# Patient Record
Sex: Female | Born: 1980 | Race: Black or African American | Hispanic: No | Marital: Single | Smoking: Former smoker
Health system: Southern US, Community
[De-identification: ages and names within clinical notes are randomized; demographics above are authoritative.]

## PROBLEM LIST (undated history)

## (undated) DIAGNOSIS — R0600 Dyspnea, unspecified: Secondary | ICD-10-CM

## (undated) DIAGNOSIS — E785 Hyperlipidemia, unspecified: Secondary | ICD-10-CM

## (undated) DIAGNOSIS — G43909 Migraine, unspecified, not intractable, without status migrainosus: Secondary | ICD-10-CM

## (undated) DIAGNOSIS — D259 Leiomyoma of uterus, unspecified: Secondary | ICD-10-CM

## (undated) DIAGNOSIS — I10 Essential (primary) hypertension: Secondary | ICD-10-CM

## (undated) DIAGNOSIS — R51 Headache: Secondary | ICD-10-CM

## (undated) DIAGNOSIS — D649 Anemia, unspecified: Secondary | ICD-10-CM

## (undated) HISTORY — PX: UTERINE FIBROID SURGERY: SHX826

## (undated) HISTORY — PX: TONSILLECTOMY: SUR1361

---

## 2001-03-23 ENCOUNTER — Emergency Department (HOSPITAL_COMMUNITY): Admission: EM | Admit: 2001-03-23 | Discharge: 2001-03-23 | Payer: Self-pay | Admitting: *Deleted

## 2001-03-31 ENCOUNTER — Emergency Department (HOSPITAL_COMMUNITY): Admission: EM | Admit: 2001-03-31 | Discharge: 2001-03-31 | Payer: Self-pay | Admitting: Emergency Medicine

## 2002-10-05 ENCOUNTER — Encounter: Payer: Self-pay | Admitting: Emergency Medicine

## 2002-10-05 ENCOUNTER — Emergency Department (HOSPITAL_COMMUNITY): Admission: EM | Admit: 2002-10-05 | Discharge: 2002-10-05 | Payer: Self-pay | Admitting: *Deleted

## 2003-02-20 ENCOUNTER — Emergency Department (HOSPITAL_COMMUNITY): Admission: EM | Admit: 2003-02-20 | Discharge: 2003-02-20 | Payer: Self-pay | Admitting: Emergency Medicine

## 2003-02-20 ENCOUNTER — Encounter: Payer: Self-pay | Admitting: Emergency Medicine

## 2003-03-04 ENCOUNTER — Encounter: Payer: Self-pay | Admitting: Orthopaedic Surgery

## 2003-03-04 ENCOUNTER — Ambulatory Visit (HOSPITAL_COMMUNITY): Admission: RE | Admit: 2003-03-04 | Discharge: 2003-03-04 | Payer: Self-pay | Admitting: Orthopaedic Surgery

## 2003-10-04 ENCOUNTER — Emergency Department (HOSPITAL_COMMUNITY): Admission: EM | Admit: 2003-10-04 | Discharge: 2003-10-04 | Payer: Self-pay | Admitting: *Deleted

## 2003-11-08 HISTORY — PX: TUBAL LIGATION: SHX77

## 2003-11-10 ENCOUNTER — Emergency Department (HOSPITAL_COMMUNITY): Admission: EM | Admit: 2003-11-10 | Discharge: 2003-11-10 | Payer: Self-pay | Admitting: Emergency Medicine

## 2003-12-10 ENCOUNTER — Emergency Department (HOSPITAL_COMMUNITY): Admission: EM | Admit: 2003-12-10 | Discharge: 2003-12-11 | Payer: Self-pay | Admitting: Emergency Medicine

## 2003-12-11 ENCOUNTER — Inpatient Hospital Stay (HOSPITAL_COMMUNITY): Admission: AD | Admit: 2003-12-11 | Discharge: 2003-12-16 | Payer: Self-pay | Admitting: Obstetrics & Gynecology

## 2004-01-07 ENCOUNTER — Ambulatory Visit (HOSPITAL_COMMUNITY): Admission: AD | Admit: 2004-01-07 | Discharge: 2004-01-07 | Payer: Self-pay | Admitting: Obstetrics & Gynecology

## 2004-02-04 ENCOUNTER — Ambulatory Visit (HOSPITAL_COMMUNITY): Admission: AD | Admit: 2004-02-04 | Discharge: 2004-02-04 | Payer: Self-pay | Admitting: Obstetrics & Gynecology

## 2004-02-09 ENCOUNTER — Ambulatory Visit (HOSPITAL_COMMUNITY): Admission: AD | Admit: 2004-02-09 | Discharge: 2004-02-09 | Payer: Self-pay | Admitting: Obstetrics & Gynecology

## 2004-08-09 ENCOUNTER — Ambulatory Visit (HOSPITAL_COMMUNITY): Admission: RE | Admit: 2004-08-09 | Discharge: 2004-08-09 | Payer: Self-pay | Admitting: Family Medicine

## 2004-09-02 ENCOUNTER — Emergency Department (HOSPITAL_COMMUNITY): Admission: EM | Admit: 2004-09-02 | Discharge: 2004-09-02 | Payer: Self-pay | Admitting: Emergency Medicine

## 2004-09-15 ENCOUNTER — Ambulatory Visit: Payer: Self-pay | Admitting: Family Medicine

## 2004-10-13 ENCOUNTER — Ambulatory Visit: Payer: Self-pay | Admitting: Family Medicine

## 2005-01-02 ENCOUNTER — Emergency Department (HOSPITAL_COMMUNITY): Admission: EM | Admit: 2005-01-02 | Discharge: 2005-01-02 | Payer: Self-pay | Admitting: Emergency Medicine

## 2005-01-11 ENCOUNTER — Ambulatory Visit: Payer: Self-pay | Admitting: Family Medicine

## 2005-01-20 ENCOUNTER — Ambulatory Visit (HOSPITAL_COMMUNITY): Admission: RE | Admit: 2005-01-20 | Discharge: 2005-01-20 | Payer: Self-pay | Admitting: Obstetrics and Gynecology

## 2006-01-11 ENCOUNTER — Encounter (INDEPENDENT_AMBULATORY_CARE_PROVIDER_SITE_OTHER): Payer: Self-pay | Admitting: *Deleted

## 2006-01-30 ENCOUNTER — Other Ambulatory Visit: Admission: RE | Admit: 2006-01-30 | Discharge: 2006-01-30 | Payer: Self-pay | Admitting: Family Medicine

## 2006-01-30 ENCOUNTER — Ambulatory Visit: Payer: Self-pay | Admitting: Family Medicine

## 2006-03-30 ENCOUNTER — Ambulatory Visit: Payer: Self-pay | Admitting: Family Medicine

## 2006-12-13 ENCOUNTER — Emergency Department (HOSPITAL_COMMUNITY): Admission: EM | Admit: 2006-12-13 | Discharge: 2006-12-14 | Payer: Self-pay | Admitting: Emergency Medicine

## 2007-01-23 ENCOUNTER — Emergency Department (HOSPITAL_COMMUNITY): Admission: EM | Admit: 2007-01-23 | Discharge: 2007-01-23 | Payer: Self-pay | Admitting: Emergency Medicine

## 2007-07-28 ENCOUNTER — Emergency Department (HOSPITAL_COMMUNITY): Admission: EM | Admit: 2007-07-28 | Discharge: 2007-07-28 | Payer: Self-pay | Admitting: Emergency Medicine

## 2007-08-01 ENCOUNTER — Encounter (INDEPENDENT_AMBULATORY_CARE_PROVIDER_SITE_OTHER): Payer: Self-pay | Admitting: *Deleted

## 2007-08-01 ENCOUNTER — Other Ambulatory Visit: Admission: RE | Admit: 2007-08-01 | Discharge: 2007-08-01 | Payer: Self-pay | Admitting: Family Medicine

## 2007-08-01 ENCOUNTER — Ambulatory Visit: Payer: Self-pay | Admitting: Family Medicine

## 2007-08-01 ENCOUNTER — Encounter: Payer: Self-pay | Admitting: Family Medicine

## 2007-08-02 ENCOUNTER — Encounter: Payer: Self-pay | Admitting: Family Medicine

## 2007-08-02 LAB — CONVERTED CEMR LAB
CO2: 25 meq/L (ref 19–32)
Candida species: NEGATIVE
Chlamydia, DNA Probe: NEGATIVE
Cholesterol: 226 mg/dL — ABNORMAL HIGH (ref 0–200)
Creatinine, Ser: 0.74 mg/dL (ref 0.40–1.20)
Eosinophils Absolute: 0 10*3/uL (ref 0.0–0.7)
Eosinophils Relative: 0 % (ref 0–5)
Glucose, Bld: 80 mg/dL (ref 70–99)
HDL: 60 mg/dL (ref >39)
Hemoglobin: 12.5 g/dL (ref 12.0–15.0)
LDL Cholesterol: 148 mg/dL — ABNORMAL HIGH (ref 0–99)
MCV: 77.8 fL — ABNORMAL LOW (ref 78.0–100.0)
Monocytes Relative: 7 % (ref 3–11)
Neutrophils Relative %: 55 % (ref 43–77)
Platelets: 328 10*3/uL (ref 150–400)
Potassium: 4.1 meq/L (ref 3.5–5.3)
RBC: 4.99 M/uL (ref 3.87–5.11)
TSH: 0.772 microintl units/mL (ref 0.350–5.50)
Total CHOL/HDL Ratio: 3.8
Trichomonal Vaginitis: NEGATIVE
Triglycerides: 91 mg/dL (ref <150)
WBC: 9 10*3/uL (ref 4.0–10.5)

## 2007-11-08 ENCOUNTER — Encounter: Payer: Self-pay | Admitting: Family Medicine

## 2007-11-08 HISTORY — PX: KNEE ARTHROSCOPY W/ ACL RECONSTRUCTION: SHX1858

## 2007-11-08 HISTORY — PX: KNEE ARTHROSCOPY W/ MENISCAL REPAIR: SHX1877

## 2008-01-10 ENCOUNTER — Emergency Department (HOSPITAL_COMMUNITY): Admission: EM | Admit: 2008-01-10 | Discharge: 2008-01-10 | Payer: Self-pay | Admitting: Emergency Medicine

## 2008-01-26 ENCOUNTER — Emergency Department (HOSPITAL_COMMUNITY): Admission: EM | Admit: 2008-01-26 | Discharge: 2008-01-26 | Payer: Self-pay | Admitting: Emergency Medicine

## 2008-02-07 ENCOUNTER — Encounter: Payer: Self-pay | Admitting: Orthopedic Surgery

## 2008-02-11 ENCOUNTER — Ambulatory Visit (HOSPITAL_COMMUNITY): Admission: RE | Admit: 2008-02-11 | Discharge: 2008-02-11 | Payer: Self-pay | Admitting: Orthopaedic Surgery

## 2008-02-20 ENCOUNTER — Ambulatory Visit: Payer: Self-pay | Admitting: Family Medicine

## 2008-02-21 ENCOUNTER — Ambulatory Visit (HOSPITAL_COMMUNITY): Admission: RE | Admit: 2008-02-21 | Discharge: 2008-02-21 | Payer: Self-pay | Admitting: Orthopaedic Surgery

## 2008-02-21 ENCOUNTER — Encounter: Payer: Self-pay | Admitting: Family Medicine

## 2008-03-05 ENCOUNTER — Ambulatory Visit: Payer: Self-pay | Admitting: Orthopedic Surgery

## 2008-03-11 ENCOUNTER — Encounter (HOSPITAL_COMMUNITY): Admission: RE | Admit: 2008-03-11 | Discharge: 2008-04-10 | Payer: Self-pay | Admitting: Orthopedic Surgery

## 2008-03-11 ENCOUNTER — Encounter: Payer: Self-pay | Admitting: Orthopedic Surgery

## 2008-03-20 ENCOUNTER — Encounter: Payer: Self-pay | Admitting: Orthopedic Surgery

## 2008-03-24 ENCOUNTER — Encounter: Payer: Self-pay | Admitting: Orthopedic Surgery

## 2008-03-24 ENCOUNTER — Telehealth: Payer: Self-pay | Admitting: Orthopedic Surgery

## 2008-04-02 ENCOUNTER — Encounter (INDEPENDENT_AMBULATORY_CARE_PROVIDER_SITE_OTHER): Payer: Self-pay | Admitting: *Deleted

## 2008-04-03 ENCOUNTER — Encounter: Payer: Self-pay | Admitting: Orthopedic Surgery

## 2008-04-07 ENCOUNTER — Ambulatory Visit: Payer: Self-pay | Admitting: Orthopedic Surgery

## 2008-04-11 ENCOUNTER — Ambulatory Visit: Payer: Self-pay | Admitting: Orthopedic Surgery

## 2008-04-11 ENCOUNTER — Ambulatory Visit (HOSPITAL_COMMUNITY): Admission: RE | Admit: 2008-04-11 | Discharge: 2008-04-11 | Payer: Self-pay | Admitting: Orthopedic Surgery

## 2008-04-14 ENCOUNTER — Encounter: Payer: Self-pay | Admitting: Orthopedic Surgery

## 2008-04-14 ENCOUNTER — Encounter (HOSPITAL_COMMUNITY): Admission: RE | Admit: 2008-04-14 | Discharge: 2008-05-14 | Payer: Self-pay | Admitting: Orthopedic Surgery

## 2008-04-15 ENCOUNTER — Ambulatory Visit: Payer: Self-pay | Admitting: Orthopedic Surgery

## 2008-04-21 ENCOUNTER — Ambulatory Visit: Payer: Self-pay | Admitting: Orthopedic Surgery

## 2008-04-23 ENCOUNTER — Ambulatory Visit: Payer: Self-pay | Admitting: Family Medicine

## 2008-04-26 ENCOUNTER — Encounter: Payer: Self-pay | Admitting: Family Medicine

## 2008-04-26 LAB — CONVERTED CEMR LAB
BUN: 11 mg/dL (ref 6–23)
CO2: 23 meq/L (ref 19–32)
Calcium: 9.3 mg/dL (ref 8.4–10.5)
Chloride: 103 meq/L (ref 96–112)
Creatinine, Ser: 0.68 mg/dL (ref 0.40–1.20)
Eosinophils Relative: 1 % (ref 0–5)
Glucose, Bld: 90 mg/dL (ref 70–99)
HCT: 36.7 % (ref 36.0–46.0)
LDL Cholesterol: 160 mg/dL — ABNORMAL HIGH (ref 0–99)
Lymphs Abs: 3.2 10*3/uL (ref 0.7–4.0)
MCV: 78.3 fL (ref 78.0–100.0)
Monocytes Absolute: 0.5 10*3/uL (ref 0.1–1.0)
Potassium: 4.5 meq/L (ref 3.5–5.3)
WBC: 9.5 10*3/uL (ref 4.0–10.5)

## 2008-04-29 ENCOUNTER — Ambulatory Visit: Payer: Self-pay | Admitting: Orthopedic Surgery

## 2008-05-06 ENCOUNTER — Telehealth: Payer: Self-pay | Admitting: Orthopedic Surgery

## 2008-05-06 ENCOUNTER — Ambulatory Visit (HOSPITAL_COMMUNITY): Admission: RE | Admit: 2008-05-06 | Discharge: 2008-05-06 | Payer: Self-pay | Admitting: Orthopedic Surgery

## 2008-05-06 ENCOUNTER — Ambulatory Visit: Payer: Self-pay | Admitting: Orthopedic Surgery

## 2008-05-08 ENCOUNTER — Ambulatory Visit: Payer: Self-pay | Admitting: Orthopedic Surgery

## 2008-05-12 ENCOUNTER — Ambulatory Visit: Payer: Self-pay | Admitting: Orthopedic Surgery

## 2008-05-14 ENCOUNTER — Encounter: Payer: Self-pay | Admitting: Orthopedic Surgery

## 2008-05-15 ENCOUNTER — Encounter (HOSPITAL_COMMUNITY): Admission: RE | Admit: 2008-05-15 | Discharge: 2008-06-14 | Payer: Self-pay | Admitting: Orthopedic Surgery

## 2008-05-15 ENCOUNTER — Ambulatory Visit: Payer: Self-pay | Admitting: Orthopedic Surgery

## 2008-05-16 ENCOUNTER — Encounter: Payer: Self-pay | Admitting: Orthopedic Surgery

## 2008-05-22 ENCOUNTER — Emergency Department (HOSPITAL_COMMUNITY): Admission: EM | Admit: 2008-05-22 | Discharge: 2008-05-22 | Payer: Self-pay | Admitting: Emergency Medicine

## 2008-05-22 ENCOUNTER — Telehealth: Payer: Self-pay | Admitting: Orthopedic Surgery

## 2008-05-23 ENCOUNTER — Telehealth: Payer: Self-pay | Admitting: Orthopedic Surgery

## 2008-05-26 ENCOUNTER — Ambulatory Visit: Payer: Self-pay | Admitting: Orthopedic Surgery

## 2008-05-29 ENCOUNTER — Telehealth: Payer: Self-pay | Admitting: Orthopedic Surgery

## 2008-05-30 ENCOUNTER — Ambulatory Visit (HOSPITAL_COMMUNITY): Admission: RE | Admit: 2008-05-30 | Discharge: 2008-05-30 | Payer: Self-pay | Admitting: Orthopedic Surgery

## 2008-05-30 ENCOUNTER — Encounter: Payer: Self-pay | Admitting: Orthopedic Surgery

## 2008-05-30 ENCOUNTER — Telehealth: Payer: Self-pay | Admitting: Orthopedic Surgery

## 2008-06-03 ENCOUNTER — Ambulatory Visit: Payer: Self-pay | Admitting: Orthopedic Surgery

## 2008-06-05 ENCOUNTER — Telehealth: Payer: Self-pay | Admitting: Orthopedic Surgery

## 2008-06-09 ENCOUNTER — Telehealth: Payer: Self-pay | Admitting: Family Medicine

## 2008-06-13 ENCOUNTER — Encounter: Payer: Self-pay | Admitting: Orthopedic Surgery

## 2008-06-16 ENCOUNTER — Ambulatory Visit: Payer: Self-pay | Admitting: Orthopedic Surgery

## 2008-06-18 ENCOUNTER — Encounter (HOSPITAL_COMMUNITY): Admission: RE | Admit: 2008-06-18 | Discharge: 2008-07-18 | Payer: Self-pay | Admitting: Orthopedic Surgery

## 2008-06-20 ENCOUNTER — Encounter: Payer: Self-pay | Admitting: Orthopedic Surgery

## 2008-06-23 ENCOUNTER — Telehealth: Payer: Self-pay | Admitting: Orthopedic Surgery

## 2008-06-24 ENCOUNTER — Ambulatory Visit: Payer: Self-pay | Admitting: Family Medicine

## 2008-06-24 DIAGNOSIS — F329 Major depressive disorder, single episode, unspecified: Secondary | ICD-10-CM | POA: Insufficient documentation

## 2008-06-24 DIAGNOSIS — E785 Hyperlipidemia, unspecified: Secondary | ICD-10-CM | POA: Insufficient documentation

## 2008-06-24 DIAGNOSIS — E669 Obesity, unspecified: Secondary | ICD-10-CM

## 2008-07-15 ENCOUNTER — Telehealth: Payer: Self-pay | Admitting: Orthopedic Surgery

## 2008-07-28 ENCOUNTER — Ambulatory Visit: Payer: Self-pay | Admitting: Orthopedic Surgery

## 2008-07-28 DIAGNOSIS — S83509A Sprain of unspecified cruciate ligament of unspecified knee, initial encounter: Secondary | ICD-10-CM

## 2008-08-04 ENCOUNTER — Telehealth: Payer: Self-pay | Admitting: Orthopedic Surgery

## 2008-08-07 ENCOUNTER — Ambulatory Visit: Payer: Self-pay | Admitting: Family Medicine

## 2008-08-08 ENCOUNTER — Telehealth: Payer: Self-pay | Admitting: Family Medicine

## 2008-08-08 ENCOUNTER — Encounter: Payer: Self-pay | Admitting: Family Medicine

## 2008-08-08 LAB — CONVERTED CEMR LAB
Candida species: NEGATIVE
Chlamydia, DNA Probe: NEGATIVE

## 2008-08-13 ENCOUNTER — Telehealth: Payer: Self-pay | Admitting: Family Medicine

## 2008-08-17 DIAGNOSIS — N760 Acute vaginitis: Secondary | ICD-10-CM | POA: Insufficient documentation

## 2008-08-25 ENCOUNTER — Telehealth: Payer: Self-pay | Admitting: Orthopedic Surgery

## 2008-08-29 ENCOUNTER — Encounter: Payer: Self-pay | Admitting: Family Medicine

## 2008-08-29 LAB — CONVERTED CEMR LAB
Cholesterol: 199 mg/dL (ref 0–200)
HDL: 44 mg/dL (ref >39)
LDL Cholesterol: 122 mg/dL — ABNORMAL HIGH (ref 0–99)
Total CHOL/HDL Ratio: 4.5
Triglycerides: 166 mg/dL — ABNORMAL HIGH (ref <150)
VLDL: 33 mg/dL (ref 0–40)

## 2008-10-08 ENCOUNTER — Telehealth: Payer: Self-pay | Admitting: Orthopedic Surgery

## 2008-10-27 ENCOUNTER — Telehealth: Payer: Self-pay | Admitting: Orthopedic Surgery

## 2008-11-03 ENCOUNTER — Emergency Department (HOSPITAL_COMMUNITY): Admission: EM | Admit: 2008-11-03 | Discharge: 2008-11-03 | Payer: Self-pay | Admitting: Emergency Medicine

## 2008-11-11 ENCOUNTER — Ambulatory Visit: Payer: Self-pay | Admitting: Family Medicine

## 2008-11-11 DIAGNOSIS — R1011 Right upper quadrant pain: Secondary | ICD-10-CM

## 2008-11-11 DIAGNOSIS — N3000 Acute cystitis without hematuria: Secondary | ICD-10-CM | POA: Insufficient documentation

## 2008-11-11 DIAGNOSIS — D259 Leiomyoma of uterus, unspecified: Secondary | ICD-10-CM

## 2008-11-11 LAB — CONVERTED CEMR LAB
Nitrite: NEGATIVE
Protein, U semiquant: 30
pH: 6.5

## 2008-11-12 ENCOUNTER — Encounter: Payer: Self-pay | Admitting: Family Medicine

## 2008-11-12 ENCOUNTER — Telehealth: Payer: Self-pay | Admitting: Family Medicine

## 2008-11-12 LAB — CONVERTED CEMR LAB
Bilirubin Urine: NEGATIVE
Candida species: NEGATIVE
Chlamydia, DNA Probe: NEGATIVE
GC Probe Amp, Genital: NEGATIVE
Hemoglobin, Urine: NEGATIVE
Nitrite: NEGATIVE
Protein, ur: 30 mg/dL — AB
RBC / HPF: NONE SEEN (ref <3)
Trichomonal Vaginitis: NEGATIVE

## 2008-11-20 ENCOUNTER — Telehealth: Payer: Self-pay | Admitting: Family Medicine

## 2008-11-21 ENCOUNTER — Ambulatory Visit (HOSPITAL_COMMUNITY): Admission: RE | Admit: 2008-11-21 | Discharge: 2008-11-21 | Payer: Self-pay | Admitting: Family Medicine

## 2008-11-22 ENCOUNTER — Encounter: Payer: Self-pay | Admitting: Family Medicine

## 2008-11-26 ENCOUNTER — Telehealth: Payer: Self-pay | Admitting: Family Medicine

## 2008-11-26 ENCOUNTER — Encounter: Payer: Self-pay | Admitting: Family Medicine

## 2009-02-04 ENCOUNTER — Telehealth: Payer: Self-pay | Admitting: Family Medicine

## 2009-11-27 ENCOUNTER — Emergency Department (HOSPITAL_COMMUNITY): Admission: EM | Admit: 2009-11-27 | Discharge: 2009-11-27 | Payer: Self-pay | Admitting: Emergency Medicine

## 2010-01-15 ENCOUNTER — Emergency Department (HOSPITAL_COMMUNITY): Admission: EM | Admit: 2010-01-15 | Discharge: 2010-01-15 | Payer: Self-pay | Admitting: Emergency Medicine

## 2010-11-28 ENCOUNTER — Encounter: Payer: Self-pay | Admitting: Obstetrics and Gynecology

## 2010-11-28 ENCOUNTER — Encounter: Payer: Self-pay | Admitting: Family Medicine

## 2010-12-07 NOTE — Letter (Signed)
Summary: progress notes  progress notes   Imported By: Curtis Sites 04/26/2010 14:56:31  _____________________________________________________________________  External Attachment:    Type:   Image     Comment:   External Document

## 2010-12-07 NOTE — Letter (Signed)
Summary: phone notes  phone notes   Imported By: Curtis Sites 04/26/2010 14:56:13  _____________________________________________________________________  External Attachment:    Type:   Image     Comment:   External Document

## 2010-12-07 NOTE — Letter (Signed)
Summary: phone notes  phone notes   Imported By: Curtis Sites 04/26/2010 14:54:23  _____________________________________________________________________  External Attachment:    Type:   Image     Comment:   External Document

## 2010-12-07 NOTE — Letter (Signed)
Summary: labs  labs   Imported By: Curtis Sites 04/26/2010 14:54:41  _____________________________________________________________________  External Attachment:    Type:   Image     Comment:   External Document

## 2010-12-07 NOTE — Letter (Signed)
Summary: history and physical  history and physical   Imported By: Curtis Sites 04/26/2010 14:52:51  _____________________________________________________________________  External Attachment:    Type:   Image     Comment:   External Document

## 2010-12-07 NOTE — Letter (Signed)
Summary: demographic  demographic   Imported By: Curtis Sites 04/26/2010 14:51:27  _____________________________________________________________________  External Attachment:    Type:   Image     Comment:   External Document

## 2010-12-07 NOTE — Letter (Signed)
Summary: misc  misc   Imported By: Curtis Sites 04/26/2010 14:54:08  _____________________________________________________________________  External Attachment:    Type:   Image     Comment:   External Document

## 2010-12-07 NOTE — Letter (Signed)
Summary: consult  consult   Imported By: Curtis Sites 04/26/2010 14:50:57  _____________________________________________________________________  External Attachment:    Type:   Image     Comment:   External Document

## 2011-01-31 ENCOUNTER — Other Ambulatory Visit: Payer: Self-pay | Admitting: Internal Medicine

## 2011-01-31 DIAGNOSIS — N920 Excessive and frequent menstruation with regular cycle: Secondary | ICD-10-CM

## 2011-02-02 ENCOUNTER — Other Ambulatory Visit: Payer: Self-pay

## 2011-02-08 ENCOUNTER — Ambulatory Visit
Admission: RE | Admit: 2011-02-08 | Discharge: 2011-02-08 | Disposition: A | Discharge status: Home / Self Care | Payer: Medicaid Other | Source: Ambulatory Visit | Attending: Internal Medicine | Admitting: Internal Medicine

## 2011-02-08 DIAGNOSIS — N920 Excessive and frequent menstruation with regular cycle: Secondary | ICD-10-CM

## 2011-03-22 NOTE — Op Note (Signed)
[name], PETRON               ACCOUNT NO.:  [id number]   MEDICAL RECORD NO.:  [id number]          PATIENT TYPE:  AMB   LOCATION:  DAY                           FACILITY:  [facility]   PHYSICIAN:  Vickki Hearing, M.D.DATE OF BIRTH:  02/29/1981   DATE OF PROCEDURE:  05/06/2008  DATE OF DISCHARGE:                               OPERATIVE REPORT   HISTORY:  She is 30 years old.  She presented to me for a second opinion  for right knee pain.  She was injured on March 21 secondary to a fall.  MRI on April 6 showed a torn medial meniscus strain pattern of the  anterior cruciate ligament.  She was treated with Naprosyn, Vicodin  Extra Strength, knee immobilizer by Dr. Hilda Lias and then sought second  opinion for evaluation for ACL reconstruction.  The patient went to  physical therapy to regain knee motion, had a knee arthroscopy, and exam  under anesthesia.  At that time, we found her to have a torn medial  meniscus and she had a partial medial meniscectomy.  She also was found  have a torn ACL which was debrided.  She was placed back in physical  therapy and then presented back to the office to schedule ACL  reconstruction.  Informed consent was done in the office.   PREOPERATIVE DIAGNOSIS:  Torn anterior cruciate ligament of the right  knee.   POSTOPERATIVE DIAGNOSIS:  Torn anterior cruciate ligament of the right  knee.   PROCEDURE:  ACL reconstruction with patellar tendon allograft.   SURGEON:  Vickki Hearing, MD   ASSISTANT:   [place]   ANESTHESIA:  General.   OPERATIVE FINDINGS:  The ACL had previously been debrided.  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   SPECIMENS:  There were no specimens.   ESTIMATED BLOOD LOSS:  Minimal.   COMPLICATIONS:  None.   COUNTS:  Correct.   TOURNIQUET TIME:  75 minutes.   DETAILS OF PROCEDURE:  After proper site  identification and marking, the  patient was confirmed as Jannette Spanner.  Her history and physical was  updated.   She had Ancef.  She was taken to surgery, had general anesthetic, was  placed on the operating table supine, right leg in a arthroscopic leg  holder, and left leg in a well-leg holder.  Right leg was prepped with  DuraPrep and sterile draping technique was performed.   Time-out procedure was then completed.  Everyone agreed that Jannette Spanner was having ACL reconstruction with allograft.  Allograft was  present.  Imaging studies were present.   Exam under anesthesia was performed.  Pivot shift was confirmed.  Lachman test was confirmed.  Collateral ligaments were stable.   The scope was introduced to the lateral portal.  Medial portal was  established.  Notchplasty was performed with a bur.  Soft tissue was  debrided from the lateral wall of the femur.  Residual ACL tissue was  resected.  Menisci were stable.  The tibial guide was set for 55 degrees and placed in the ACL footprint  with 7 mm offset PCL/ACL guide.  Medial incision was made near the pes  tendons.  Subcutaneous tissue was divided, subperiosteal dissection was  done, and the guide was placed back in the knee.  A 11-mm tunnel was  drilled over a guidewire followed by insertion of the 7-mm offset over-  the-top guide which was then followed by insertion of a wire which was  then over-reamed with a 10-mm guide.  Back wall was confirmed.  Over-the-  top position was easily identifiable by palpation and visualization  prior to drilling   Scope was then taken out of the knee.  The graft was repaired on the  back table, then put on a tensioning device with 10-mm bone plugs, 100-  mm graft length.   The knee was then irrigated, suctioned, and the graft was passed.  Secured proximally with a 7 x 23 mm bioabsorbable screw through a  separate accessory anterior portal.  Graft security was confirmed by  pulling  on distal sutures.  Knee was then placed in extension and a 11 x  35 mm screw was passed in the tibial tunnel and the range of motion was  checked.  The graft was cycled prior to distal fixation.  Lachman test  was excellent, graded at 0.   Wounds were irrigated and closed with 0 Monocryl, 3-0 Prolene with Steri-  Strips over the top.  Portal sites were closed with 3-0 Prolene as well.  We injected a total of 30 mL of Marcaine in the joint and then applied  sterile dressings, cryo cuff, and knee immobilizer.  Tourniquet was  released.   The patient was extubated and taken to recovery room in stable  condition.   DISCHARGE MEDICATIONS:  1. Tylox one q.4 p.r.n. pain.  2. Phenergan 25 mg q.6 p.r.n. nausea.  3. Ibuprofen 800 q.8 h. p.r.n. pain and swelling.   The patient's weightbearing is as tolerated.  Followup on July 2 at 2  p.m.   Physical therapy can start on Monday.  Follow routine protocol.      Vickki Hearing, M.D.  Electronically Signed     SEH/MEDQ  D:  05/06/2008  T:  05/06/2008  Job:  [id number]

## 2011-03-22 NOTE — Op Note (Signed)
[name], [name]               ACCOUNT NO.:  [id number]   MEDICAL RECORD NO.:  [id number]          PATIENT TYPE:  AMB   LOCATION:  DAY                           FACILITY:  [facility]   PHYSICIAN:  Vickki Hearing, M.D.DATE OF BIRTH:  Dec 25, 1980   DATE OF PROCEDURE:  04/11/2008  DATE OF DISCHARGE:                               OPERATIVE REPORT   HISTORY:  This 30 year old female was initially seen by Dr. Hilda [name] and  sought second opinion.  She wanted her care transferred to me which I  accepted.  Dr. Hilda [name] provided excellent care up until that point.  The  patient was injured on January 26, 2008 secondary to a fall, had x-rays,  and eventually had an MRI on February 11, 2008 which showed a torn medial  meniscus and a possible ACL tear, although it looked more like a strain  pattern.  When I saw her, she had no movement in the knee and I sent her  to therapy to regain motion which she did.  However, her pain persisted  despite naproxen and Vicodin and we discussed surgical options which she  accepted.  Informed consent was performed in the office and she accepted  the fact that she could have persistent pain, swelling, or instability.   PREOPERATIVE DIAGNOSIS:  Torn medial meniscus, possible anterior  cruciate ligament tear of the right knee.   POSTOPERATIVE DIAGNOSIS:  Torn medial meniscus, anterior cruciate  ligament tear, right knee.   PROCEDURE:  1. Exam under anesthesia.  2. Arthroscopy, right knee.  3. Partial medial meniscectomy and debridement of anterior cruciate      ligament stump.   SURGEON:  Vickki Hearing, MD.   ASSISTANTS:  None.   ANESTHESIA:  General LMA.   OPERATIVE FINDINGS:  Under anesthesia, the patient had full range of  motion.  She had a 1+ Lachman test with a firm endpoint.  She had a  positive pivot shift grade 1.  This was compared to her opposite well  leg.  Full range of motion was noted.  Collateral ligaments were stable.  Intraoperative  findings included a torn medial meniscus, a grade 1  chondral lesion of the medial femoral condyle, and an absent lateral  wall sign and a torn ACL.  Lateral meniscus and compartment intact.  Patellofemoral joint intact as well.   SPECIMENS:  There were no specimens.   ESTIMATED BLOOD LOSS:  Minimal.   COMPLICATIONS:  None.   COUNTS:  Correct.   DISPOSITION:  The patient went to PACU in good condition.   PROCEDURE IN DETAIL:  This patient was identified as [name].  Proper site marking was performed by the patient and physician on the  right knee, procedure confirmed, and history and physical updated.  The  patient given preop antibiotics of 1 gram Ancef and taken to surgery for  general anesthetic.  Successful anesthesia was obtained and the right  knee was then examined under anesthesia with findings as noted.   After DuraPrep and sterile drape, a lateral portal was made, medial  portal was made, and diagnostic arthroscopy was  performed.   Through the medial portal, upbiting forceps was used to resect the torn  meniscus.  Suction was used to remove the meniscal fragments and balance  the meniscus.   Attention was then turned to the notch where the stump was debrided and  lateral wall was empty of any ACL tissue and whatever remnant was left  was nonfunctional.   Lateral side was then addressed and was found to be clean.  Patellofemoral joint was clean.   Knee was irrigated.  Meniscal fragments and debris left over from the  meniscectomy were suctioned from the knee.  The knee was then injected  with Marcaine and dilute epinephrine solution.  Portal sites were closed  with 3-0 nylon.   Dressing applied was Xeroform, 4 x 4s, ABD, Webril, Ace wrap, and cryo  cuff.   The patient was extubated and taken to recovery room in stable  condition.   POSTOPERATIVE PLAN:  Weight bearing as tolerated.  Physical therapy to  start early next week and I will discuss the  possibility of  reconstruction with the patient as she is 30 years old and has  relatively good cartilage still left in the knee.      Vickki Hearing, M.D.  Electronically Signed     SEH/MEDQ  D:  04/11/2008  T:  04/12/2008  Job:  [id number]

## 2011-03-22 NOTE — H&P (Signed)
[name], [name]               ACCOUNT NO.:  [id number]   MEDICAL RECORD NO.:  [id number]          PATIENT TYPE:  AMB   LOCATION:  DAY                           FACILITY:  [facility]   PHYSICIAN:  Vickki Hearing, M.D.DATE OF BIRTH:  Jul 06, 1981   DATE OF ADMISSION:  DATE OF DISCHARGE:  LH                              HISTORY & PHYSICAL   CHIEF COMPLAINT:  Torn right ACL.   HISTORY:  This is a 30 year old female who presented to me on March 05, 2008 as a second opinion, regarding right knee pain.  She was injured on  March 21 secondary to a fall, had an MRI on February 11, 2008 which was read  as torn medial meniscus and strain pattern of the anterior cruciate  ligament.  She presented with treatment history of Naprosyn 500 mg a day  and Vicodin ES and treatment with a knee immobilizer since January 26, 2008 through March 05, 2008 by Dr. Teola Bradley, M.D.   When I saw her she essentially had very little knee motion, and I sent  her to physical therapy to regain knee motion in preparation for a knee  arthroscopy.  She was diligent with physical therapy, did well, and  eventually underwent right knee arthroscopy examination under  anesthesia.  At that time we found her to have a torn medial meniscus  which was treated with partial medial meniscectomy.  She had a torn  anterior cruciate ligament which was debrided.  She was placed back in  physical therapy, and presented back on June 23rd for reevaluation and  we noted her to have excellent range of motion but an unstable knee  secondary to the ACL deficiency   We reviewed surgical procedure with her, and informed consent was given  with the understanding of possibility of retear, need for physical  therapy, or the knee will become stiff; and the risks of bleeding,  infection, DVT, etc.  She agreed to surgery.   She continues on Vicodin.  She has no allergies.  She does have  hypertension and high cholesterol.  She has had a tubal  ligation.  She  has a family history of cancer, diabetes, arthritis, and asthma.  She is  single and unemployed.  She does not drink or smoke.  She denies fever,  chest pain, shortness of breath, nausea, vomiting, dizziness, numbness;  and has had some dysuria in the past, none at present.  She denies  thyroid disease.  Complains of depression.  Denies mood swings.  Complains of eczema and itching.  Denies cancer.  Complains of poor  vision.  Denies cataracts.  Complains of sinusitis.  Denies allergies.  Denies lymph node disease.   PHYSICAL EXAMINATION:  GENERAL:  Shows a well-developed, well-nourished  large female with no deformities.  Normal grooming.  Her gait pattern  has returned to near normal as long as there is no pivoting.  SKIN:  Intact.  EXTREMITIES: She has normal pulse, perfusion, and temperature with no  edema or tenderness.  Sensation and coordination were normal.  She has  normal strength  in all four extremities.  Reflexes are also symmetric  and normal.  NEUROLOGIC:  She is awake, alert and oriented x3.  Her mood and affect  are pleasant.  She has no cervical adenopathy.   Her right knee has now full motion, a positive Lachman, positive drawer,  positive pivot.  No medial or lateral joint line tenderness.  No  effusion.  Neurovascular exam of the limb is intact.   IMPRESSION:  Anterior cruciate ligament deficient, right knee.   PLAN:  Right ACL reconstruction with allograft.   Surgery scheduled for June 30th.   FOLLOWUP:  Scheduled for July 2 at 12:00 p.m.      Vickki Hearing, M.D.  Electronically Signed     SEH/MEDQ  D:  05/05/2008  T:  05/05/2008  Job:  [id number]   cc:   [facility]  Fax: [phone]

## 2011-03-22 NOTE — H&P (Signed)
[name], [name]               ACCOUNT NO.:  [id number]   MEDICAL RECORD NO.:  [id number]          PATIENT TYPE:  AMB   LOCATION:  DAY                           FACILITY:  [facility]   PHYSICIAN:  J. Darreld Mclean, M.D. DATE OF BIRTH:  November 20, 1980   DATE OF ADMISSION:  DATE OF DISCHARGE:  LH                              HISTORY & PHYSICAL   CHIEF COMPLAINT:  Right knee pain.   HISTORY OF PRESENT ILLNESS:  The patient was seen by me for the first  time on February 07, 2008, complaining of pain and tenderness in the right  knee.  She went to the emergency room complaining of pain in the knee  for several days on January 26, 2008.  X-rays were negative.  She had  fallen that day but was having trouble with the knee before then.  She  has had giving way of the knee and swelling.  There is no other injury.  I was concerned about a medial meniscal tear and recommended MRI of the  knee.  This was done on February 11, 2008, at the hospital.  MRI shows tear  of the radial body of the medial meniscus with extrusion of the medial  meniscus.  She has some degeneration of the ACL, consistent with a  previously seen partial tear of the ACL with incompetence of the ACL.  She has medial compartment osteoarthritis and possibly a small tear on  the lateral meniscus.  I informed her of the findings on February 21, 2008,  and she has elected to have surgery of the knee on the 21st.  Risks and  imponderables of the procedure were discussed with the patient.  She  appeared to understand and asked appropriate questions.   PAST MEDICAL HISTORY:  Positive for hypertension.  Denies any other  diseases.   ALLERGIES:  She has no allergies.   MEDICATIONS:  Vicodin ES for pain.   She does not smoke.  She does not use alcohol.   Dr. Lodema Hong is her family doctor.   PAST SURGICAL HISTORY:  Status post tubal ligation in 2006.  This is the  only surgery.   FAMILY HISTORY:  Diabetes and hypertension runs in the family.   [place] and is single.   PHYSICAL EXAMINATION:  VITAL SIGNS:  BP is 112/68, pulse 80,  respirations 16, afebrile, 5 feet 7-1/2 inches, 242 pounds.  GENERAL APPEARANCE:  Patient is alert and oriented and cooperative.  HEENT:  Negative.  NECK:  Supple.  LUNGS:  Clear to P&A.  HEART:  Regular without murmurs.  ABDOMEN:  Obese.  EXTREMITIES:  Right knee has pain and tenderness with an effusion and  pain over the medial joint line.  She has a positive McMurray medially.  She has no distal edema.  Other extremities are negative.  SKIN:  Intact.  CNS:  Intact.   IMPRESSION:  Tear of the medial meniscus of the right knee with  degenerative joint disease.   PLAN:  Operative arthroscopy of right knee as an outpatient.   Labs are pending.  ______________________________  Shela Commons. Darreld Mclean, M.D.     JWK/MEDQ  D:  02/25/2008  T:  02/25/2008  Job:  [id number]

## 2011-03-22 NOTE — H&P (Signed)
NAMEMARILYN, NIHISER               ACCOUNT NO.:  [id number]   MEDICAL RECORD NO.:  [id number]          PATIENT TYPE:  AMB   LOCATION:  DAY                           FACILITY:  [facility]   PHYSICIAN:  Vickki Hearing, M.D.DATE OF BIRTH:  June 04, 1981   DATE OF ADMISSION:  DATE OF DISCHARGE:  LH                              HISTORY & PHYSICAL   CHIEF COMPLAINT:  Right knee pain.   HISTORY:  This is a 30 year old female who was initially seen by Dr.  Hilda Lias and sought second opinion.  Wanted to transfer care to me which  I accepted.  Dr. Hilda Lias had provided excellent care for her up until I  saw her.  She was injured on January 26, 2008 secondary to a fall.  Had x-  rays at the same time.  Had an MRI on February 11, 2008 which showed torn  medial meniscus and possibility of an ACL tear, though it looked more  like a strain pattern.  She complained initially of pain and had no real  movement in the right knee.  She was sent for physical therapy to regain  her motion which she has done, but her pain has persisted.  She has been  on Naproxen and Vicodin with the assistance of therapy have  improved  the knee, but symptomatic pain and lack of full function have persisted  and has caused [place] to pursue with surgery.   Informed consent was done in the office.  Things discussed include  persistent pain, swelling, instability.  Current tear was discussed as  well.   ALLERGIES:  NO KNOWN ALLERGIES.   MEDICATIONS:  1. Hydrocodone.  2. Ibuprofen.  3. Also took some Naprosyn 500.   PAST MEDICAL HISTORY:  1. Hypertension.  2. High cholesterol.  3. She had a BTL in 2006.   FAMILY HISTORY:  Cancer, diabetes, arthritis and asthma.   SOCIAL HISTORY:  She is unemployed, single.   SOCIAL HABITS:  No alcohol use or tobacco use.   PHYSICAL EXAMINATION:  VITAL SIGNS:  Stable.  GENERAL:  Appearance is normal in terms of development, nutrition,  grooming, hygiene.  No deformity.  Body habitus would be  described as  ectomorphic.  CARDIOVASCULAR:  Observation and palpation were normal.  LYMPH NODES:  Negative.  SKIN:  Inspection and palpation revealed no abnormalities.  NEURO:  Showed normal coordination, reflexes and sensation.  PSYCHIATRIC:  She is awake, alert and oriented x3.  Mood and affect were  normal.  MUSCULOSKELETAL:  Gait has returned to normal with a slight limp.  Right knee has 125 degrees of flexion, 5 degrees of hyperextension.  She  continues to have medial joint line tenderness and pain with a positive  McMurray sign.  ACL appears to be stable, but will need reevaluation  under anesthesia.   IMPRESSION:  Medial meniscal tear right knee, 836.0, questionable  sprained anterior cruciate ligament.   PLAN:  1. Examination under anesthesia right knee with arthroscopy, right      knee and partial medial meniscectomy.  2. Surgery is scheduled April 11, 2008.  3. Followup  is scheduled April 15, 2008.      Vickki Hearing, M.D.  Electronically Signed     SEH/MEDQ  D:  04/10/2008  T:  04/10/2008  Job:  [id number]   cc:   [facility]

## 2011-03-25 NOTE — Consult Note (Signed)
NAMEJALAIYAH, [name]                         ACCOUNT NO.:  [id number]   MEDICAL RECORD NO.:  [id number]                   PATIENT TYPE:  EMS   LOCATION:  ED                                   FACILITY:  [facility]   PHYSICIAN:  Tilda Burrow, M.D.              DATE OF BIRTH:  1981-05-26   DATE OF CONSULTATION:  12/10/2003  DATE OF DISCHARGE:  12/11/2003                                   CONSULTATION   ER CONSULT   CHIEF COMPLAINT:  Painless vaginal bleeding early second trimester.   HISTORY OF PRESENT ILLNESS:  A 30 year old female with last normal menstrual  period August 24, 2003 with known pregnancy followed through out office,  gravida 1, para 0 who has had 1 prior episode of vaginal bleeding this  pregnancy and is not sexually active at this time due to instructions after  the first bleeding episode.  She began bleeding 3-4 hours ago and passed  large clots with fluid.  She has had otherwise straight forward pregnancy  with fetal heart tones documented in the office.  She presents at 9:10 p.m.  with temperature of 99.4, blood pressure 133/67, pulse 102, respirations 20.   Exam by Dr. Margretta Ditty in the emergency room reveals a nontender uterus 2 cm  below the umbilicus. Speculum exam revealed blood at the os with no active  bleeding and no tissue present.  Cervix appears closed.  Lab review includes  blood type Rh positive.  The patient is observed for a period of time.  Fetal heart documentation in the 140s is performed.   IMPRESSION:  Early second trimester bleeding, suspect placenta abnormality  such as low lying placenta.  Cannot rule out previa without ultrasound.   PLAN:  Will continue pelvic rest, allow patient to go home tonight with  follow up tomorrow morning in the office. The patient instructed that if  recurrent bleeding occurs tonight, that is she returns to the emergency  room, that inpatient observation will be performed; but, the likelihood of  that considered  low.  Patient comfortable with management plan.      ___________________________________________                                            Tilda Burrow, M.D.   JVF/MEDQ  D:  12/10/2003  T:  12/11/2003  Job:  [id number]

## 2011-03-25 NOTE — H&P (Signed)
NAMEJESSICAMARIE, [name]                         ACCOUNT NO.:  [id number]   MEDICAL RECORD NO.:  [id number]                   PATIENT TYPE:  INP   LOCATION:  LDR4                                 FACILITY:  [facility]   PHYSICIAN:  Lazaro Arms, M.D.                DATE OF BIRTH:  [birth year]   DATE OF ADMISSION:  12/11/2003  DATE OF DISCHARGE:                                HISTORY & PHYSICAL   HISTORY OF PRESENT ILLNESS:  [name] is a 30 year old African-American  female gravida 1, para 0 with estimated date of delivery of May 14, 2004, by  last menstrual period and a first trimester ultrasound, currently at 17  weeks and 6 days gestation.  The patient stated she experience a large gush  of fluid on the evening of December 10, 2003, with a small amount of pink  tinge in it and it ran down her leg.  She comes to the office in the morning  of December 11, 2003.  An ultrasound was performed and she has a low AFI of  4.  I did a sterile speculum exam which revealed no pooling but it was  Nitrazine positive and fern was nonconfirmatory.  There really was not any  fluid in the vagina at that time.  The fetal heart rate was 160, there was a  stomach bubble and there was a bladder, so we feel like this is secondary to  extreme pre-term rupture of membranes.  As a result, she is admitted to the  hospital for bedrest, IV fluid, IV antibiotics and to see if she  reaccumulates any fluid or goes into labor.  The patient understands the  risk of cord accident, the risk of infection, the risk of labor, all of  which would result in a nonviable baby delivery.   PAST MEDICAL HISTORY:  Negative.   PAST SURGICAL HISTORY:  She had surgery on her right knee 2004.   ALLERGIES:  NONE.   MEDICATIONS:  None.   PAST OB HISTORY:  She is nulliparous.   SOCIAL HISTORY:  She works at [employer]  She lives with her  parents and the father of the baby is supportive.  She has a family history  of  hypertension.  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  Hepatitis B  is negative, HIV is nonreactive, serology is nonreactive, Pap was normal, GC  and Chlamydia were both negative.  Her MSAFP was normal.   PHYSICAL EXAMINATION:  HEENT:  Unremarkable.  THYROID:  Normal.  LUNGS:  Clear.  HEART:  Shows a regular rate and rhythm, no murmurs, rubs or gallop.  BREASTS:  Without masses, discharge or skin changes.  ABDOMEN:  Fundal height just below the umbilicus.  PELVIC EXAM:  Not performed.  EXTREMITIES:  Warmth, no edema.   IMPRESSION:  1.  Intrauterine pregnancy at 60 and 6 days gestation, estimated date of     delivery of May 14, 2004.  2. Extreme pre-term rupture of the membranes.   PLAN:  The patient is admitted for hydration, bedrest, antibiotics.  The  patient understands if she does not reaccumulate fluid this will result in  pulmonary hypoplasia.  She also understands the risk of cord accident,  labor, infection, all of which will result in nonviable fetus.  She  understands the chances of successful pregnancy outcome are small but we  will try as much as we can for that glimmer of hope.  The patient is wanting  that and desires to have bedrest and conservative management as long as  there is a chance that there may be some reaccumulation of fluid.     ___________________________________________                                         Lazaro Arms, M.D.   LHE/MEDQ  D:  12/11/2003  T:  12/11/2003  Job:  [id number]

## 2011-03-25 NOTE — Discharge Summary (Signed)
[name], [name]                         ACCOUNT NO.:  [id number]   MEDICAL RECORD NO.:  [id number]                   PATIENT TYPE:  INP   LOCATION:  LDR4                                 FACILITY:  [facility]   PHYSICIAN:  Lazaro Arms, M.D.                DATE OF BIRTH:  10/22/1981   DATE OF ADMISSION:  12/11/2003  DATE OF DISCHARGE:  12/16/2003                                 DISCHARGE SUMMARY   DISCHARGE DIAGNOSES:  1. Intrauterine pregnancy at 18-5/7ths weeks' gestation.  2. Premature rupture of membranes.   PROCEDURE:  Admission to the hospital with daily care and discharge  management.   Please refer to the transcribed history and physical on the antepartum chart  details admission to the hospital.   HOSPITAL COURSE:  The patient was admitted, on 12/11/2003, with a complaint  of having a large gush of fluid the evening prior to admission.  She came to  the office, was evaluated and had nitrazine positive vaginal scrapings but  really had no pooling of fluid.  The patient stated that about 30-45 minutes  after she had the gush of fluid, she had no more fluid come out.  She also  had a little bit of bleeding.  No contractions and no fever.  Ultrasound,  performed, revealed  an AFI of 4.  There were kidneys present, bladder  present and a stomach bubble.  As a result in light of oligohydramnios with  a good history of gush of fluid at a 17 week and 6 day gestation, the  patient was presumed to have rupture of membranes.  The patient was admitted  to the hospital, underwent serial white blood cell counts, IV antibiotics  and bed rest and serial ultrasound fluid determinations.  It was felt that  since she had no further fluid gushes that possibly the hole in the amniotic  sack had resealed.  She occasionally had some fluid pass especially this  past Sunday and also some pink and sometimes darker blood.  The patient  denies any contractions.  She has not had any fever since she  has been in  the hospital.  Her white count has been between 9.5-10.5 the entire time.  Her amniotic fluid volume, on the day of discharge, was 4.6 which is  essentially stable with the previous day's with a viable singleton female  fetus.  The patient understands the tenuous nature of the pregnancy.  She  understands that at any time there could be a cord accident causing sudden  fetal death.  She understands the possibility of going into labor with  premature delivery at a nonviable gestational age, and she understands the  risk of pulmonary hypoplasia should the pregnancy continue.   She is discharged to home on ampicillin.   She will be seen in the office at the end of the week for evaluation of  amniotic fluid index, and  she will be seen at [facility] next week if  the baby is still viable discuss issues regarding long term viability  including the pulmonary hypoplasia.  The patient understands all of these  issues and will stay on bed rest at home, remain on pelvic rest, and if she  has any problems in the meantime, she will give [place] a call.     ___________________________________________                                         Lazaro Arms, M.D.   LHE/MEDQ  D:  12/16/2003  T:  12/17/2003  Job:  [id number]

## 2011-03-25 NOTE — H&P (Signed)
NAMESHERL, [name]               ACCOUNT NO.:  [id number]   MEDICAL RECORD NO.:  [id number]          PATIENT TYPE:  AMB   LOCATION:  DAY                           FACILITY:  [facility]   PHYSICIAN:  Tilda Burrow, M.D. DATE OF BIRTH:  1981/03/04   DATE OF ADMISSION:  01/20/2005  DATE OF DISCHARGE:  LH                                HISTORY & PHYSICAL   ADMITTING DIAGNOSIS:  Desire for elective permanent sterilization.   HISTORY OF PRESENT ILLNESS:  This 30 year old female, gravida 1, para 1,  prior preterm delivery at [facility] in 2005, is admitted at  this time for elective permanent sterilization.  [name] had requested  permanent sterilization December 07, 2004, was counseled over different birth  control options, given information regarding permanent sterilization  including Krames booklets.  She is very clear and calm in her explanation  that she plans no further childbearing.  She is comfortable with this, and  has consent of her partner.  She is currently on Depo-Provera.   PAST MEDICAL HISTORY:  Benign.   SURGICAL HISTORY:  Negative.  IUD use in the past.  Not currently utilized.   OBSTETRIC HISTORY:  Vaginal delivery at 31 weeks at [facility] after a miraculous pregnancy.  Notable for preterm, premature  rupture of membranes at 18 weeks with spontaneous vaginal delivery at 31  weeks at [facility] after a 3-week hospitalization there.   ALLERGIES:  None.   FAMILY HISTORY:  Positive for hypertension.   PHYSICAL EXAMINATION:  GENERAL:  A large-framed African-American female,  alert and oriented x3.  HEENT:  Pupils equal, round and reactive.  Extraocular movements intact.  NECK:  Supple.  Trachea midline.  CHEST:  Clear to auscultation.  ABDOMEN:  Obese without masses.  PELVIC:  External genitalia normal.  Vaginal exam shows normal structure of  uterus.  Normal size, shape, and contour.  Adnexa negative for masses.   IMPRESSION:  Desire  for elective permanent sterilization.   PLAN:  Laparoscopic tubal sterilization, fallopian rings, January 20, 2005.      JVF/MEDQ  D:  01/19/2005  T:  01/19/2005  Job:  [id number]   cc:   Milus Mallick. Lodema Hong, M.D.  [address]  [place], [place] [address]  Fax: [phone]

## 2011-03-25 NOTE — Op Note (Signed)
[name], LAMBING               ACCOUNT NO.:  [id number]   MEDICAL RECORD NO.:  [id number]          PATIENT TYPE:  AMB   LOCATION:  DAY                           FACILITY:  [facility]   PHYSICIAN:  Tilda Burrow, M.D. DATE OF BIRTH:  Dec 29, 1980   DATE OF PROCEDURE:  01/20/2005  DATE OF DISCHARGE:  01/20/2005                                 OPERATIVE REPORT   PREOPERATIVE DIAGNOSIS:  Elective sterilization.   POSTOPERATIVE DIAGNOSIS:  Elective sterilization.   PROCEDURE:  Laparoscopic tubal sterilization, Falope rings.   SURGEON:  Tilda Burrow, M.D.   Threasa HeadsAsencion Noble.   ANESTHESIA:  General.   COMPLICATIONS:  Technically challenging placement due to stocky fallopian  tubes bilaterally.   ESTIMATED BLOOD LOSS:  Minimal.   DETAILS OF PROCEDURE:  The patient was taken to the operating room and  prepped and draped for combined abdominal and vaginal procedure.  An  infraumbilical 1 cm skin incision was made at the level of the transverse  suprapubic incision.  Prior to that the patient's cervix had been grasped  with a single-tooth tenaculum and in-and-out catheterization of the bladder  performed.  The Veress needle was used to enter the peritoneal cavity while  elevating the abdominal wall and we were able to achieve pneumoperitoneum  with ease.  After 3 L CO2 was used, the 10 mm laparoscopic trocar was  introduced under direct visualization, attention to the directed to the  suprapubic area and an 8 mm suprapubic trocar introduced under direct  visualization.  Attention was directed to the left adnexa, where the  fallopian tubes were identified as being shorter than usual and thicker,  thus technically more challenging for the Falope ring application.  We were  careful to grasp the rings with the Falope ring applier, gently bringing the  midsegment knuckle of tube into the ring and the ring fired across the  incarcerated knuckle of tube.  The knuckle of tube was  smaller than usual,  but it did appear that sufficient incarceration was performed to perform  tubal obstruction.  This was inspected carefully and then the percutaneous  0.25% Marcaine used to infiltrate in the broad ligament and underneath in  the incarcerated knuckle of tube.  The opposite side was inspected, again  found to be stockier and shorter in the fallopian tube area.  Similar  technical challenges were encountered, but we were gradually able to pull  the stocky fallopian tube into the ring applier and fire with apparent  satisfactory success.  We infiltrated beneath the tube with Marcaine.  At  this point hemostasis was complete.  Deflation of the abdomen was performed  and the subcutaneous skin closure was performed without difficulty.  Deep  closure of the umbilical incision was also performed using 4-0 Dexon in the  deep connective tissue just above the fascia.  The fascia itself could not  be accessed due to the abdominal wall thickness.   I will discuss the Falope ring application issues with the patient at her  follow-up visit and give consideration to an HSG if the patient desires  to  confirm tubal obstruction.      JVF/MEDQ  D:  02/10/2005  T:  02/10/2005  Job:  [id number]

## 2011-06-08 ENCOUNTER — Encounter (HOSPITAL_COMMUNITY): Payer: Self-pay | Admitting: *Deleted

## 2011-07-01 ENCOUNTER — Ambulatory Visit (HOSPITAL_COMMUNITY)
Admission: RE | Admit: 2011-07-01 | Discharge: 2011-07-01 | Disposition: A | Discharge status: Home / Self Care | Payer: Medicaid Other | Source: Ambulatory Visit | Attending: Obstetrics & Gynecology | Admitting: Obstetrics & Gynecology

## 2011-07-01 ENCOUNTER — Ambulatory Visit (HOSPITAL_COMMUNITY): Payer: Medicaid Other | Admitting: Anesthesiology

## 2011-07-01 ENCOUNTER — Encounter (HOSPITAL_COMMUNITY)
Admission: RE | Disposition: A | Discharge status: Home / Self Care | Payer: Self-pay | Source: Ambulatory Visit | Attending: Obstetrics & Gynecology

## 2011-07-01 ENCOUNTER — Encounter (HOSPITAL_COMMUNITY): Payer: Self-pay | Admitting: Anesthesiology

## 2011-07-01 ENCOUNTER — Other Ambulatory Visit: Payer: Self-pay | Admitting: Obstetrics & Gynecology

## 2011-07-01 ENCOUNTER — Encounter (HOSPITAL_COMMUNITY): Payer: Self-pay | Admitting: Obstetrics & Gynecology

## 2011-07-01 DIAGNOSIS — E785 Hyperlipidemia, unspecified: Secondary | ICD-10-CM

## 2011-07-01 DIAGNOSIS — S83509A Sprain of unspecified cruciate ligament of unspecified knee, initial encounter: Secondary | ICD-10-CM

## 2011-07-01 DIAGNOSIS — N3000 Acute cystitis without hematuria: Secondary | ICD-10-CM

## 2011-07-01 DIAGNOSIS — E669 Obesity, unspecified: Secondary | ICD-10-CM

## 2011-07-01 DIAGNOSIS — F329 Major depressive disorder, single episode, unspecified: Secondary | ICD-10-CM

## 2011-07-01 DIAGNOSIS — R1011 Right upper quadrant pain: Secondary | ICD-10-CM

## 2011-07-01 DIAGNOSIS — N938 Other specified abnormal uterine and vaginal bleeding: Secondary | ICD-10-CM | POA: Insufficient documentation

## 2011-07-01 DIAGNOSIS — N949 Unspecified condition associated with female genital organs and menstrual cycle: Secondary | ICD-10-CM | POA: Insufficient documentation

## 2011-07-01 DIAGNOSIS — N760 Acute vaginitis: Secondary | ICD-10-CM

## 2011-07-01 DIAGNOSIS — D259 Leiomyoma of uterus, unspecified: Secondary | ICD-10-CM | POA: Diagnosis present

## 2011-07-01 HISTORY — DX: Headache: R51

## 2011-07-01 HISTORY — DX: Leiomyoma of uterus, unspecified: D25.9

## 2011-07-01 LAB — CBC
HCT: 37.5 % (ref 36.0–46.0)
MCH: 24.1 pg — ABNORMAL LOW (ref 26.0–34.0)
MCHC: 31.7 g/dL (ref 30.0–36.0)
MCV: 76.1 fL — ABNORMAL LOW (ref 78.0–100.0)
Platelets: 263 10*3/uL (ref 150–400)
RDW: 15.7 % — ABNORMAL HIGH (ref 11.5–15.5)

## 2011-07-01 SURGERY — DILATATION & CURETTAGE/HYSTEROSCOPY WITH HYDROTHERMAL ABLATION
Anesthesia: General | Site: Vagina | Wound class: Clean Contaminated

## 2011-07-01 MED ORDER — FENTANYL CITRATE 0.05 MG/ML IJ SOLN
25.0000 ug | INTRAMUSCULAR | Status: DC | PRN
  Administered 2011-07-01: 50 ug via INTRAVENOUS

## 2011-07-01 MED ORDER — MIDAZOLAM HCL 5 MG/5ML IJ SOLN
INTRAMUSCULAR | Status: DC | PRN
  Administered 2011-07-01: 2 mg via INTRAVENOUS

## 2011-07-01 MED ORDER — FENTANYL CITRATE 0.05 MG/ML IJ SOLN
INTRAMUSCULAR | Status: AC
  Filled 2011-07-01: qty 2

## 2011-07-01 MED ORDER — KETOROLAC TROMETHAMINE 30 MG/ML IJ SOLN: 15.0000 mg | Freq: Once | INTRAMUSCULAR | Status: DC | PRN

## 2011-07-01 MED ORDER — KETOROLAC TROMETHAMINE 30 MG/ML IJ SOLN
INTRAMUSCULAR | Status: DC | PRN
  Administered 2011-07-01: 30 mg via INTRAVENOUS

## 2011-07-01 MED ORDER — PROPOFOL 10 MG/ML IV EMUL
INTRAVENOUS | Status: AC
  Filled 2011-07-01: qty 20

## 2011-07-01 MED ORDER — DEXAMETHASONE SODIUM PHOSPHATE 4 MG/ML IJ SOLN
INTRAMUSCULAR | Status: DC | PRN
  Administered 2011-07-01: 10 mg via INTRAVENOUS

## 2011-07-01 MED ORDER — ONDANSETRON HCL 4 MG/2ML IJ SOLN
INTRAMUSCULAR | Status: DC | PRN
  Administered 2011-07-01: 4 mg via INTRAVENOUS

## 2011-07-01 MED ORDER — IBUPROFEN 800 MG PO TABS: 800.0000 mg | ORAL_TABLET | Freq: Three times a day (TID) | ORAL | Status: AC | PRN

## 2011-07-01 MED ORDER — FENTANYL CITRATE 0.05 MG/ML IJ SOLN
INTRAMUSCULAR | Status: DC | PRN
  Administered 2011-07-01 (×2): 50 ug via INTRAVENOUS

## 2011-07-01 MED ORDER — LIDOCAINE HCL (CARDIAC) 20 MG/ML IV SOLN
INTRAVENOUS | Status: DC | PRN
  Administered 2011-07-01: 60 mg via INTRAVENOUS

## 2011-07-01 MED ORDER — LIDOCAINE HCL (CARDIAC) 20 MG/ML IV SOLN
INTRAVENOUS | Status: AC
  Filled 2011-07-01: qty 5

## 2011-07-01 MED ORDER — LACTATED RINGERS IV SOLN
INTRAVENOUS | Status: DC
  Administered 2011-07-01 (×2): via INTRAVENOUS

## 2011-07-01 MED ORDER — LIDOCAINE HCL 2 % IJ SOLN
INTRAMUSCULAR | Status: DC | PRN
  Administered 2011-07-01: 10 mL via INTRADERMAL

## 2011-07-01 MED ORDER — OXYCODONE-ACETAMINOPHEN 5-325 MG PO TABS: 2.0000 | ORAL_TABLET | Freq: Four times a day (QID) | ORAL | Status: AC | PRN

## 2011-07-01 MED ORDER — ONDANSETRON HCL 4 MG/2ML IJ SOLN
INTRAMUSCULAR | Status: AC
  Filled 2011-07-01: qty 2

## 2011-07-01 MED ORDER — PROPOFOL 10 MG/ML IV EMUL
INTRAVENOUS | Status: DC | PRN
  Administered 2011-07-01: 180 mg via INTRAVENOUS

## 2011-07-01 MED ORDER — MIDAZOLAM HCL 2 MG/2ML IJ SOLN
INTRAMUSCULAR | Status: AC
  Filled 2011-07-01: qty 2

## 2011-07-01 MED ORDER — DEXAMETHASONE SODIUM PHOSPHATE 10 MG/ML IJ SOLN
INTRAMUSCULAR | Status: AC
  Filled 2011-07-01: qty 1

## 2011-07-01 MED ORDER — FENTANYL CITRATE 0.05 MG/ML IJ SOLN
INTRAMUSCULAR | Status: AC
  Administered 2011-07-01: 50 ug via INTRAVENOUS
  Filled 2011-07-01: qty 2

## 2011-07-01 MED ORDER — SODIUM CHLORIDE 0.9 % IR SOLN
Status: DC | PRN
  Administered 2011-07-01: 1000 mL

## 2011-07-01 SURGICAL SUPPLY — 18 items
CATH ROBINSON RED A/P 16FR (CATHETERS) ×1 IMPLANT
CLOTH BEACON ORANGE TIMEOUT ST (SAFETY) ×2 IMPLANT
CONTAINER PREFILL 10% NBF 60ML (FORM) ×4 IMPLANT
DRAPE CAMERA CLOSED 9X96 (DRAPES) ×2 IMPLANT
DRAPE HYSTEROSCOPY (DRAPE) ×2 IMPLANT
DRAPE UTILITY XL STRL (DRAPES) ×2 IMPLANT
ELECT REM PT RETURN 9FT ADLT (ELECTROSURGICAL)
ELECTRODE REM PT RTRN 9FT ADLT (ELECTROSURGICAL) IMPLANT
GLOVE BIO SURGEON STRL SZ 6.5 (GLOVE) ×4 IMPLANT
GOWN PREVENTION PLUS LG XLONG (DISPOSABLE) ×4 IMPLANT
NDL SPNL 22GX3.5 QUINCKE BK (NEEDLE) ×1 IMPLANT
NEEDLE SPNL 22GX3.5 QUINCKE BK (NEEDLE) ×2 IMPLANT
NS IRRIG 1000ML POUR BTL (IV SOLUTION) ×2 IMPLANT
PACK VAGINAL MINOR WOMEN LF (CUSTOM PROCEDURE TRAY) ×2 IMPLANT
SET GENESYS HTA PROCERVA (MISCELLANEOUS) ×1 IMPLANT
SYR CONTROL 10ML LL (SYRINGE) ×2 IMPLANT
TOWEL OR 17X24 6PK STRL BLUE (TOWEL DISPOSABLE) ×4 IMPLANT
WATER STERILE IRR 1000ML POUR (IV SOLUTION) ×2 IMPLANT

## 2011-07-01 NOTE — H&P (Signed)
  Chief Complaint: 30 y.o.  who presents with symptomatic uterine fibroids  Details of Present Illness: H/O AUB.  BP 117/76  Pulse 81  Temp(Src) 99.5 F (37.5 C) (Oral)  Resp 18  Ht 5\' 7"  (1.702 m)  Wt 111.131 kg (245 lb)  BMI 38.37 kg/m2  SpO2 100%  LMP 06/03/2011  Past Medical History  Diagnosis Date  . Headache     migraines - on daily meds  . Leiomyoma of uterus, unspecified 07/01/2011   History   Social History  . Marital Status: Single    Spouse Name: N/A    Number of Children: N/A  . Years of Education: N/A   Occupational History  . Not on file.   Social History Main Topics  . Smoking status: Former Smoker    Types: Cigarettes    Quit date: 06/08/2007  . Smokeless tobacco: Not on file  . Alcohol Use: Yes     couple of times a year  . Drug Use: No  . Sexually Active: Yes    Birth Control/ Protection: Surgical   Other Topics Concern  . Not on file   Social History Narrative  . No narrative on file   History reviewed. No pertinent family history.  Pertinent items are noted in HPI.  Pre-Op Diagnosis: uterine fibroids;aub   Planned Procedure: Procedure(s): DILATATION & CURETTAGE/HYSTEROSCOPY WITH HYDROTHERMAL ABLATION  I have reviewed the patient's history and have completed the physical exam and [name] is acceptable for surgery.  Roseanna Rainbow, MD 07/01/2011 1:13 PM

## 2011-07-01 NOTE — Transfer of Care (Signed)
  Anesthesia Post-op Note  Patient: [name]  Procedure(s) Performed:  DILATATION & CURETTAGE/HYSTEROSCOPY WITH HYDROTHERMAL ABLATION  Patient Location: PACU  Anesthesia Type: General  Level of Consciousness: awake, alert , oriented, patient cooperative and responds to stimulation  Airway and Oxygen Therapy: Patient Spontanous Breathing and Patient connected to nasal cannula oxygen  Post-op Pain: none  Post-op Assessment: Post-op Vital signs reviewed and Patient's Cardiovascular Status Stable  Post-op Vital Signs: Reviewed and stable  Complications: No apparent anesthesia complications

## 2011-07-01 NOTE — Anesthesia Preprocedure Evaluation (Addendum)
Anesthesia Evaluation  Name, MR# and DOB Patient awake  General Assessment Comment  Reviewed: Allergy & Precautions, H&P , NPO status , Patient's Chart, lab work & pertinent test results, reviewed documented beta blocker date and time   History of Anesthesia Complications (+) MALIGNANT HYPERTHERMIANegative for: history of anesthetic complications  Airway Mallampati: I TM Distance: >3 FB Neck ROM: full    Dental  (+) Teeth Intact   Pulmonary  clear to auscultation  breath sounds clear to auscultation none    Cardiovascular Exercise Tolerance: Good regular Normal    Neuro/Psych   Headaches (migraines - daily)     GI/Hepatic/Renal negative GI ROS  negative Liver ROS  negative Renal ROS        Endo/Other  (+)   Morbid obesity  Abdominal   Musculoskeletal   Hematology negative hematology ROS (+)   Peds  Reproductive/Obstetrics negative OB ROS    Anesthesia Other Findings            Anesthesia Physical Anesthesia Plan  ASA: II  Anesthesia Plan: General   Post-op Pain Management:    Induction:   Airway Management Planned:   Additional Equipment:   Intra-op Plan:   Post-operative Plan:   Informed Consent: I have reviewed the patients History and Physical, chart, labs and discussed the procedure including the risks, benefits and alternatives for the proposed anesthesia with the patient or authorized representative who has indicated his/her understanding and acceptance.   Dental Advisory Given  Plan Discussed with: CRNA and Surgeon  Anesthesia Plan Comments:         Anesthesia Quick Evaluation

## 2011-07-01 NOTE — Op Note (Signed)
Preoperative diagnosis: AUB/uterine fibroids  Postoperative diagnosis: Same  Procedure: Diagnostic hysteroscopy, dilatation and curettage, HTA endometrial ablation.    Surgeon: Antionette Char A  Anesthesia:LMA, paracervical block  Estimated blood loss: 50 ml  Urine output: 300 ml  IV Fluids:  per Anesthesiology  Complications: None  Specimen: PATHOLOGY  Operative Findings:  On exam under anesthesia, the uterus was anteverted--approximately 10 - 12 weeks aggregate size.  The uterus sounded to 11 cm.  The visualization at hysteroscopy was sub optimal. The left tubal ostia was seen.  The right tubal ostia was not visualized.  There were no discrete lesions.  Description of procedure:   The patient was taken to the operating room and placed on the operating table in the semi-lithotomy position in Hawthorne stirrups.  Examination under anesthesia was performed.  The patient was prepped and draped in the usual manner.  After a time-out had been completed, a speculum was placed in the vagina.  The anterior lip of the cervix was grasped with a single-toothed tenaculum.      The endocervical canal was dilated with Pratt dilators to a #23.  Marland Kitchen A small, Sims curette was used to perform an endometrial curettage. A  diagnostic hysteroscope with the HTA device attached  was used to perform a diagnostic hysteroscopy.  The ablation cycle was performed for 7 minutes in total.  The cycle was aborted secondary to an incomplete cervical seal and leakage of the distending medium.    A good result was noted.  All the instruments were removed from the vagina.  Final instrument counts were correct.  The patient was taken to the PACU in stable condition.

## 2011-07-04 NOTE — Anesthesia Postprocedure Evaluation (Signed)
Anesthesia Post Note  Patient: [name]  Procedure(s) Performed:  DILATATION & CURETTAGE/HYSTEROSCOPY WITH HYDROTHERMAL ABLATION  Anesthesia type: GA  Patient location: PACU  Post pain: Pain level controlled  Post assessment: Post-op Vital signs reviewed  Last Vitals:  Filed Vitals:   07/01/11 1545  BP: 120/74  Pulse: 89  Temp: 97.6 F (36.4 C)  Resp: 16    Post vital signs: Reviewed  Level of consciousness: sedated  Complications: No apparent anesthesia complications

## 2011-08-02 LAB — DIFFERENTIAL
Basophils Relative: 1
Eosinophils Absolute: 0.1
Monocytes Relative: 7
Neutrophils Relative %: 55

## 2011-08-02 LAB — CBC
MCHC: 34
MCV: 77.2 — ABNORMAL LOW
Platelets: 277

## 2011-08-04 LAB — BASIC METABOLIC PANEL
Calcium: 9.4
GFR calc Af Amer: >60
GFR calc non Af Amer: >60
Glucose, Bld: 90
Sodium: 139

## 2011-08-04 LAB — HEMOGLOBIN AND HEMATOCRIT, BLOOD: HCT: 37.4

## 2011-08-05 LAB — CBC
HCT: 34.5 — ABNORMAL LOW
MCHC: 33
Platelets: 297
RDW: 14.8

## 2011-08-05 LAB — DIFFERENTIAL
Basophils Absolute: 0.1
Basophils Relative: 1
Eosinophils Absolute: 0.1
Eosinophils Relative: 1
Lymphocytes Relative: 29

## 2011-08-11 LAB — WET PREP, GENITAL
Clue Cells Wet Prep HPF POC: NONE SEEN
WBC, Wet Prep HPF POC: NONE SEEN

## 2011-08-11 LAB — URINALYSIS, ROUTINE W REFLEX MICROSCOPIC
Ketones, ur: NEGATIVE mg/dL
Nitrite: NEGATIVE
Protein, ur: NEGATIVE mg/dL
Specific Gravity, Urine: 1.02 (ref 1.005–1.030)
Urobilinogen, UA: 0.2 mg/dL (ref 0.0–1.0)

## 2011-08-11 LAB — GC/CHLAMYDIA PROBE AMP, GENITAL
Chlamydia, DNA Probe: NEGATIVE
GC Probe Amp, Genital: NEGATIVE

## 2011-08-11 LAB — PREGNANCY, URINE: Preg Test, Ur: NEGATIVE

## 2011-10-29 ENCOUNTER — Emergency Department (HOSPITAL_COMMUNITY)
Admission: EM | Admit: 2011-10-29 | Discharge: 2011-10-30 | Disposition: A | Discharge status: Home / Self Care | Payer: Medicaid Other | Attending: Emergency Medicine | Admitting: Emergency Medicine

## 2011-10-29 ENCOUNTER — Encounter (HOSPITAL_COMMUNITY): Payer: Self-pay | Admitting: Emergency Medicine

## 2011-10-29 DIAGNOSIS — IMO0001 Reserved for inherently not codable concepts without codable children: Secondary | ICD-10-CM | POA: Insufficient documentation

## 2011-10-29 DIAGNOSIS — R112 Nausea with vomiting, unspecified: Secondary | ICD-10-CM | POA: Insufficient documentation

## 2011-10-29 DIAGNOSIS — J111 Influenza due to unidentified influenza virus with other respiratory manifestations: Secondary | ICD-10-CM | POA: Insufficient documentation

## 2011-10-29 DIAGNOSIS — R059 Cough, unspecified: Secondary | ICD-10-CM | POA: Insufficient documentation

## 2011-10-29 DIAGNOSIS — R05 Cough: Secondary | ICD-10-CM | POA: Insufficient documentation

## 2011-10-29 DIAGNOSIS — R509 Fever, unspecified: Secondary | ICD-10-CM | POA: Insufficient documentation

## 2011-10-29 DIAGNOSIS — R0682 Tachypnea, not elsewhere classified: Secondary | ICD-10-CM | POA: Insufficient documentation

## 2011-10-29 DIAGNOSIS — R51 Headache: Secondary | ICD-10-CM | POA: Insufficient documentation

## 2011-10-29 DIAGNOSIS — Z87891 Personal history of nicotine dependence: Secondary | ICD-10-CM | POA: Insufficient documentation

## 2011-10-29 DIAGNOSIS — R5381 Other malaise: Secondary | ICD-10-CM | POA: Insufficient documentation

## 2011-10-29 HISTORY — DX: Essential (primary) hypertension: I10

## 2011-10-29 HISTORY — DX: Hyperlipidemia, unspecified: E78.5

## 2011-10-29 MED ORDER — ALBUTEROL SULFATE (5 MG/ML) 0.5% IN NEBU
5.0000 mg | INHALATION_SOLUTION | Freq: Once | RESPIRATORY_TRACT | Status: AC
  Administered 2011-10-30: 5 mg via RESPIRATORY_TRACT
  Filled 2011-10-29: qty 1

## 2011-10-29 MED ORDER — SODIUM CHLORIDE 0.9 % IV BOLUS (SEPSIS)
1000.0000 mL | Freq: Once | INTRAVENOUS | Status: AC
  Administered 2011-10-30: 1000 mL via INTRAVENOUS

## 2011-10-29 MED ORDER — ACETAMINOPHEN 325 MG PO TABS
650.0000 mg | ORAL_TABLET | Freq: Once | ORAL | Status: AC
  Administered 2011-10-30: 650 mg via ORAL
  Filled 2011-10-29: qty 2

## 2011-10-29 NOTE — ED Notes (Signed)
Patient with flu like symptoms for the last 24 hours.  Cough and fever.

## 2011-10-30 ENCOUNTER — Emergency Department (HOSPITAL_COMMUNITY): Payer: Medicaid Other

## 2011-10-30 LAB — BASIC METABOLIC PANEL
Calcium: 9.8 mg/dL (ref 8.4–10.5)
Creatinine, Ser: 0.84 mg/dL (ref 0.50–1.10)
GFR calc non Af Amer: >90 mL/min (ref >90)
Sodium: 138 mEq/L (ref 135–145)

## 2011-10-30 LAB — CBC
Platelets: 242 10*3/uL (ref 150–400)
RBC: 4.74 MIL/uL (ref 3.87–5.11)
RDW: 15.3 % (ref 11.5–15.5)
WBC: 7.4 10*3/uL (ref 4.0–10.5)

## 2011-10-30 LAB — DIFFERENTIAL
Basophils Absolute: 0 10*3/uL (ref 0.0–0.1)
Eosinophils Absolute: 0.1 10*3/uL (ref 0.0–0.7)
Lymphocytes Relative: 6 % — ABNORMAL LOW (ref 12–46)
Lymphs Abs: 0.4 10*3/uL — ABNORMAL LOW (ref 0.7–4.0)
Neutrophils Relative %: 81 % — ABNORMAL HIGH (ref 43–77)

## 2011-10-30 MED ORDER — MORPHINE SULFATE 4 MG/ML IJ SOLN
4.0000 mg | Freq: Once | INTRAMUSCULAR | Status: AC
  Administered 2011-10-30: 4 mg via INTRAVENOUS
  Filled 2011-10-30: qty 1

## 2011-10-30 MED ORDER — IBUPROFEN 800 MG PO TABS
800.0000 mg | ORAL_TABLET | Freq: Once | ORAL | Status: AC
  Administered 2011-10-30: 800 mg via ORAL
  Filled 2011-10-30: qty 1

## 2011-10-30 MED ORDER — SODIUM CHLORIDE 0.9 % IV BOLUS (SEPSIS)
1000.0000 mL | Freq: Once | INTRAVENOUS | Status: AC
  Administered 2011-10-30: 1000 mL via INTRAVENOUS

## 2011-10-30 NOTE — ED Notes (Signed)
Pt. Discharged to home, pt. Ambulatory, gait steady

## 2011-10-30 NOTE — ED Notes (Signed)
Pt. Ambulated in hall, NAD noted

## 2011-10-30 NOTE — ED Provider Notes (Signed)
History     CSN: [id number]  Arrival date & time 10/29/11  2319   First MD Initiated Contact with Patient 10/29/11 2343      Chief Complaint  Patient presents with  . Fever  . Cough     Patient is a 30 y.o. female presenting with fever and cough. The history is provided by the patient.  Fever Primary symptoms of the febrile illness include fever, fatigue, headaches, cough, shortness of breath, nausea, vomiting and myalgias. Primary symptoms do not include abdominal pain or diarrhea. The current episode started yesterday. This is a new problem. The problem has been gradually worsening.  The fever began yesterday.  Associated with: sick contacts.  Cough Associated symptoms include headaches, myalgias and shortness of breath.  PT WITH FLU LIKE SYMPTOMS FOR PAST DAY Nothing improves symptoms   Past Medical History  Diagnosis Date  . Headache     migraines - on daily meds  . Leiomyoma of uterus, unspecified 07/01/2011  . Hypertension   . Hyperlipidemia     Past Surgical History  Procedure Date  . Tonsillectomy     AS A CHILD  . Tubal ligation 2005  . Knee arthroscopy w/ acl reconstruction 2009  . Knee arthroscopy w/ meniscal repair 2009    No family history on file.  History  Substance Use Topics  . Smoking status: Former Smoker    Types: Cigarettes    Quit date: 06/08/2007  . Smokeless tobacco: Not on file  . Alcohol Use: Yes     couple of times a year    OB History    Grav Para Term Preterm Abortions TAB SAB Ect Mult Living                  Review of Systems  Constitutional: Positive for fever and fatigue.  Respiratory: Positive for cough and shortness of breath.   Gastrointestinal: Positive for nausea and vomiting. Negative for abdominal pain and diarrhea.  Musculoskeletal: Positive for myalgias.  Neurological: Positive for headaches.  All other systems reviewed and are negative.    Allergies  Review of patient's allergies indicates no known  allergies.  Home Medications   Current Outpatient Rx  Name Route Sig Dispense Refill  . BUTALBITAL-APAP-CAFF-COD 50-325-40-30 MG PO CAPS Oral Take 1 capsule by mouth every 6 (six) hours as needed. For headache pain      . IBUPROFEN 200 MG PO TABS Oral Take 400 mg by mouth every 6 (six) hours as needed. As needed for pain     . SIMVASTATIN 20 MG PO TABS Oral Take 20 mg by mouth daily.        BP 119/58  Pulse 141  Temp(Src) 102 F (38.9 C) (Oral)  Resp 20  SpO2 92%  Physical Exam  CONSTITUTIONAL: Well developed/well nourished HEAD AND FACE: Normocephalic/atraumatic EYES: EOMI/PERRL ENMT: Mucous membranes moist, pharynx normal NECK: supple no meningeal signs CV: S1/S2 noted, no murmurs/rubs/gallops noted LUNGS: tachypneic, decreased BS bilaterally but she is able to speak to me ABDOMEN: soft, nontender, no rebound or guarding GU:no cva tenderness NEURO: Pt is awake/alert, moves all extremitiesx4 EXTREMITIES: pulses normal, full ROM SKIN: warm, color normal PSYCH: no abnormalities of mood noted   ED Course  Procedures   Labs Reviewed  BASIC METABOLIC PANEL  CBC  DIFFERENTIAL    12:15 AM Pt tachypneic, will check cxr/labs and give nebs and reassess 1:52 AM Pt feeling improved  2:39 AM Vitals improved Ambulatory and no SOB Lung sounds improved,  no tachypnea Stable for d/c Discussed strict return precautions BP 109/68  Pulse 112  Temp(Src) 99.9 F (37.7 C) (Oral)  Resp 20  SpO2 98%    MDM  Nursing notes reviewed and considered in documentation All labs/vitals reviewed and considered xrays reviewed and considered         Joya Gaskins, MD 10/30/11 [phone]

## 2013-04-24 ENCOUNTER — Ambulatory Visit: Payer: Self-pay | Admitting: Obstetrics & Gynecology

## 2013-05-16 ENCOUNTER — Ambulatory Visit: Payer: Self-pay | Admitting: Obstetrics & Gynecology

## 2014-02-12 ENCOUNTER — Emergency Department (HOSPITAL_COMMUNITY)
Admission: EM | Admit: 2014-02-12 | Discharge: 2014-02-12 | Disposition: A | Discharge status: Home / Self Care | Payer: Medicaid Other | Attending: Emergency Medicine | Admitting: Emergency Medicine

## 2014-02-12 ENCOUNTER — Encounter (HOSPITAL_COMMUNITY): Payer: Self-pay | Admitting: Emergency Medicine

## 2014-02-12 ENCOUNTER — Emergency Department (HOSPITAL_COMMUNITY): Payer: Medicaid Other

## 2014-02-12 DIAGNOSIS — S335XXA Sprain of ligaments of lumbar spine, initial encounter: Secondary | ICD-10-CM | POA: Insufficient documentation

## 2014-02-12 DIAGNOSIS — S7000XA Contusion of unspecified hip, initial encounter: Secondary | ICD-10-CM | POA: Insufficient documentation

## 2014-02-12 DIAGNOSIS — E785 Hyperlipidemia, unspecified: Secondary | ICD-10-CM | POA: Insufficient documentation

## 2014-02-12 DIAGNOSIS — Y939 Activity, unspecified: Secondary | ICD-10-CM | POA: Insufficient documentation

## 2014-02-12 DIAGNOSIS — S39012A Strain of muscle, fascia and tendon of lower back, initial encounter: Secondary | ICD-10-CM

## 2014-02-12 DIAGNOSIS — W010XXA Fall on same level from slipping, tripping and stumbling without subsequent striking against object, initial encounter: Secondary | ICD-10-CM | POA: Insufficient documentation

## 2014-02-12 DIAGNOSIS — W19XXXA Unspecified fall, initial encounter: Secondary | ICD-10-CM

## 2014-02-12 DIAGNOSIS — Z87891 Personal history of nicotine dependence: Secondary | ICD-10-CM | POA: Insufficient documentation

## 2014-02-12 DIAGNOSIS — Y929 Unspecified place or not applicable: Secondary | ICD-10-CM | POA: Insufficient documentation

## 2014-02-12 DIAGNOSIS — I10 Essential (primary) hypertension: Secondary | ICD-10-CM | POA: Insufficient documentation

## 2014-02-12 DIAGNOSIS — G43909 Migraine, unspecified, not intractable, without status migrainosus: Secondary | ICD-10-CM | POA: Insufficient documentation

## 2014-02-12 DIAGNOSIS — S7001XA Contusion of right hip, initial encounter: Secondary | ICD-10-CM

## 2014-02-12 DIAGNOSIS — R51 Headache: Secondary | ICD-10-CM | POA: Insufficient documentation

## 2014-02-12 DIAGNOSIS — D259 Leiomyoma of uterus, unspecified: Secondary | ICD-10-CM | POA: Insufficient documentation

## 2014-02-12 HISTORY — DX: Migraine, unspecified, not intractable, without status migrainosus: G43.909

## 2014-02-12 MED ORDER — ONDANSETRON 4 MG PO TBDP
4.0000 mg | ORAL_TABLET | Freq: Once | ORAL | Status: AC
  Administered 2014-02-12: 4 mg via ORAL
  Filled 2014-02-12: qty 1

## 2014-02-12 MED ORDER — CYCLOBENZAPRINE HCL 10 MG PO TABS: 10.0000 mg | ORAL_TABLET | Freq: Two times a day (BID) | ORAL | Status: DC | PRN

## 2014-02-12 MED ORDER — TRAMADOL HCL 50 MG PO TABS: 50.0000 mg | ORAL_TABLET | Freq: Four times a day (QID) | ORAL | Status: DC | PRN

## 2014-02-12 MED ORDER — HYDROCODONE-ACETAMINOPHEN 5-325 MG PO TABS
2.0000 | ORAL_TABLET | Freq: Once | ORAL | Status: AC
  Administered 2014-02-12: 2 via ORAL
  Filled 2014-02-12: qty 2

## 2014-02-12 NOTE — ED Notes (Signed)
PA at bedside.

## 2014-02-12 NOTE — Discharge Instructions (Signed)
Fall Prevention and Home Safety Falls cause injuries and can affect all age groups. It is possible to use preventive measures to significantly decrease the likelihood of falls. There are many simple measures which can make your home safer and prevent falls. OUTDOORS  Repair cracks and edges of walkways and driveways.  Remove high doorway thresholds.  Trim shrubbery on the main path into your home.  Have good outside lighting.  Clear walkways of tools, rocks, debris, and clutter.  Check that handrails are not broken and are securely fastened. Both sides of steps should have handrails.  Have leaves, snow, and ice cleared regularly.  Use sand or salt on walkways during winter months.  In the garage, clean up grease or oil spills. BATHROOM  Install night lights.  Install grab bars by the toilet and in the tub and shower.  Use non-skid mats or decals in the tub or shower.  Place a plastic non-slip stool in the shower to sit on, if needed.  Keep floors dry and clean up all water on the floor immediately.  Remove soap buildup in the tub or shower on a regular basis.  Secure bath mats with non-slip, double-sided rug tape.  Remove throw rugs and tripping hazards from the floors. BEDROOMS  Install night lights.  Make sure a bedside light is easy to reach.  Do not use oversized bedding.  Keep a telephone by your bedside.  Have a firm chair with side arms to use for getting dressed.  Remove throw rugs and tripping hazards from the floor. KITCHEN  Keep handles on pots and pans turned toward the center of the stove. Use back burners when possible.  Clean up spills quickly and allow time for drying.  Avoid walking on wet floors.  Avoid hot utensils and knives.  Position shelves so they are not too high or low.  Place commonly used objects within easy reach.  If necessary, use a sturdy step stool with a grab bar when reaching.  Keep electrical cables out of the  way.  Do not use floor polish or wax that makes floors slippery. If you must use wax, use non-skid floor wax.  Remove throw rugs and tripping hazards from the floor. STAIRWAYS  Never leave objects on stairs.  Place handrails on both sides of stairways and use them. Fix any loose handrails. Make sure handrails on both sides of the stairways are as long as the stairs.  Check carpeting to make sure it is firmly attached along stairs. Make repairs to worn or loose carpet promptly.  Avoid placing throw rugs at the top or bottom of stairways, or properly secure the rug with carpet tape to prevent slippage. Get rid of throw rugs, if possible.  Have an electrician put in a light switch at the top and bottom of the stairs. OTHER FALL PREVENTION TIPS  Wear low-heel or rubber-soled shoes that are supportive and fit well. Wear closed toe shoes.  When using a stepladder, make sure it is fully opened and both spreaders are firmly locked. Do not climb a closed stepladder.  Add color or contrast paint or tape to grab bars and handrails in your home. Place contrasting color strips on first and last steps.  Learn and use mobility aids as needed. Install an electrical emergency response system.  Turn on lights to avoid dark areas. Replace light bulbs that burn out immediately. Get light switches that glow.  Arrange furniture to create clear pathways. Keep furniture in the same place.  Firmly attach carpet with non-skid or double-sided tape.  Eliminate uneven floor surfaces.  Select a carpet pattern that does not visually hide the edge of steps.  Be aware of all pets. OTHER HOME SAFETY TIPS  Set the water temperature for 120 F (48.8 C).  Keep emergency numbers on or near the telephone.  Keep smoke detectors on every level of the home and near sleeping areas. Document Released: 10/14/2002 Document Revised: 04/24/2012 Document Reviewed: 01/13/2012 [facility] Patient Information 2014  [place].   Iliac Crest Contusion An iliac crest contusion is a deep bruise of your hip bone (hip pointer). Contusions are the result of an injury that caused bleeding under the skin. The contusion may turn blue, purple, or yellow. Minor injuries will give you a painless contusion, but more severe contusions may stay painful and swollen for a few weeks.  CAUSES  An iliac crest contusion is usually caused by a blow to the top of your hip pointer. The injury most often comes from contact sports.  SYMPTOMS   Swelling and redness of the hip area.  Bruising of the hip area.  Tenderness or soreness of the hip. DIAGNOSIS  The diagnosis can be made by taking your history and doing a physical exam. You may need an X-ray of your hip pointer to look for a broken bone (fracture). TREATMENT  Often, the best treatment for an iliac crest contusion is resting, icing, elevating, and applying cold compresses to the injured area. Over-the-counter medicines may also be recommended for pain control. Crutches may be used if walking is very painful. Some people need physical therapy to help with range of motion and strengthening.  HOME CARE INSTRUCTIONS   Put ice on the injured area.  Put ice in a plastic bag.  Place a towel between your skin and the bag.  Leave the ice on for 15-20 minutes, 03-04 times a day.  Only take over-the-counter or prescription medicines for pain, discomfort, or fever as directed by your caregiver. Your caregiver may recommend avoiding anti-inflammatory medicines (aspirin, ibuprofen, and naproxen) for 48 hours because these medicines may increase bruising.  Keep your leg straightened (extended) when possible.  Walk or move around as the pain allows, or as directed by your caregiver. Use crutches if your caregiver recommends them.  Apply compression wraps as directed by your caregiver. You may remove the wraps for sleeping, showers, and baths. SEEK IMMEDIATE MEDICAL CARE IF:    You have increased bruising or swelling.  You have pain that is getting worse.  Your swelling or pain is not relieved with medicines.  Your toes get cold. MAKE SURE YOU:   Understand these instructions.  Will watch your condition.  Will get help right away if you are not doing well or get worse. Document Released: 07/19/2001 Document Revised: 01/16/2012 Document Reviewed: 09/09/2011 [facility] Patient Information 2014 [place], [place].

## 2014-02-12 NOTE — ED Notes (Signed)
Pt reports that she slipped and fell and hit your back on the refrigerator about an hour ago. Pt denies any LOC, dizziness, numbness, or tingling. No bruising or abrasions seen on assessment.

## 2014-02-12 NOTE — ED Provider Notes (Signed)
CSN: [id number]     Arrival date & time 02/12/14  1242 History   This chart was scribed for Delos Haring PA-C, a non-physician practitioner working with No att. providers found by Denice Bors, ED Scribe. This patient was seen in room TR09C/TR09C and the patient's care was started at 6:43 PM       Chief Complaint  Patient presents with  . Fall  . Back Pain     (Consider location/radiation/quality/duration/timing/severity/associated sxs/prior Treatment) The history is provided by the patient. No language interpreter was used.   HPI Comments: [name] is a 33 y.o. female who presents to the Emergency Department complaining of fall onset PTA after slipping on water bumped right hip onto the fridge and fell onto the ground. Describes pain as shooting and worsening in severity with movement. Reports associated right low back pain. She reports that she did hit her head but has no headache, dizziness or head pain.Denies associated difficulty walking, neck pain, numbness, weakness, headache, and LOC.  Past Medical History  Diagnosis Date  . Headache(784.0)     migraines - on daily meds  . Leiomyoma of uterus, unspecified 07/01/2011  . Hypertension   . Hyperlipidemia   . Migraines t   Past Surgical History  Procedure Laterality Date  . Tonsillectomy      AS A CHILD  . Tubal ligation  2005  . Knee arthroscopy w/ acl reconstruction  2009  . Knee arthroscopy w/ meniscal repair  2009   Family History  Problem Relation Age of Onset  . Asthma Mother   . Diabetes Father   . Hypertension Father    History  Substance Use Topics  . Smoking status: Former Smoker    Types: Cigarettes    Quit date: 06/08/2007  . Smokeless tobacco: Not on file  . Alcohol Use: No     Comment: couple of times a year   OB History   Grav Para Term Preterm Abortions TAB SAB Ect Mult Living                 Review of Systems  Constitutional: Negative for fever.  Musculoskeletal: Positive for  back pain. Negative for neck pain.  Neurological: Negative for weakness, numbness and headaches.  Psychiatric/Behavioral: Negative for confusion.      Allergies  Review of patient's allergies indicates no known allergies.  Home Medications   Current Outpatient Rx  Name  Route  Sig  Dispense  Refill  . cyclobenzaprine (FLEXERIL) 10 MG tablet   Oral   Take 1 tablet (10 mg total) by mouth 2 (two) times daily as needed for muscle spasms.   20 tablet   0   . traMADol (ULTRAM) 50 MG tablet   Oral   Take 1 tablet (50 mg total) by mouth every 6 (six) hours as needed.   20 tablet   0    BP 120/72  Pulse 73  Temp(Src) 98.2 F (36.8 C) (Oral)  Resp 14  Ht 5\' 7"  (1.702 m)  Wt 230 lb (104.327 kg)  BMI 36.01 kg/m2  SpO2 100%  LMP 02/03/2014 Physical Exam  Nursing note and vitals reviewed. Constitutional: She is oriented to person, place, and time. She appears well-developed and well-nourished. No distress.  HENT:  Head: Normocephalic and atraumatic.  Eyes: Conjunctivae and EOM are normal. Pupils are equal, round, and reactive to light.  Neck: Normal range of motion. Neck supple. No tracheal deviation present.  No paraspinal cervical tenderness   Cardiovascular: Normal  rate.   Pulmonary/Chest: Effort normal. No respiratory distress.  Musculoskeletal: Normal range of motion.  No midline C-spine, T-spine, or L-spine tenderness with no step-offs or deformities noted   Neurovascularly intact to bilateral feet, decreased strength to bilateral legs due to pain with limited ROM due to pain   TTP of bilateral paraspinal lumbar region and TTP to right greater trochanter   Pt ambulatory    Neurological: She is alert and oriented to person, place, and time.  Skin: Skin is warm and dry.  Psychiatric: She has a normal mood and affect. Her behavior is normal.    ED Course  Procedures (including critical care time) COORDINATION OF CARE:  Nursing notes reviewed. Vital signs  reviewed. Initial pt interview and examination performed.   Filed Vitals:   02/12/14 1248 02/12/14 1444  BP: 119/80 120/72  Pulse: 87 73  Temp: 98.9 F (37.2 C) 98.2 F (36.8 C)  TempSrc: Oral Oral  Resp: 14 14  Height: 5\' 7"  (1.702 m)   Weight: 230 lb (104.327 kg)   SpO2: 100% 100%    6:43 PM-Discussed work up plan with pt at bedside, which includes  Orders Placed This Encounter  Procedures  . DG Hip Complete Right    Standing Status: Standing     Number of Occurrences: 1     Standing Expiration Date:     Order Specific Question:  Reason for exam:    Answer:  FALL    Order Specific Question:  Reason for exam:    Answer:  BACK PAIN  . DG Lumbar Spine Complete    Standing Status: Standing     Number of Occurrences: 1     Standing Expiration Date:     Order Specific Question:  Reason for exam:    Answer:  FALL    Order Specific Question:  Reason for exam:    Answer:  BACK PAIN  . Pt agrees with plan.   Treatment plan initiated: Medications  HYDROcodone-acetaminophen (NORCO/VICODIN) 5-325 MG per tablet 2 tablet (2 tablets Oral Given 02/12/14 1323)  ondansetron (ZOFRAN-ODT) disintegrating tablet 4 mg (4 mg Oral Given 02/12/14 1323)     Initial diagnostic testing ordered.       Labs Review Labs Reviewed - No data to display Imaging Review Dg Lumbar Spine Complete  02/12/2014   CLINICAL DATA:  Pain post trauma  EXAM: LUMBAR SPINE - COMPLETE 4+ VIEW  COMPARISON:  None.  FINDINGS: Frontal, lateral, spot lumbosacral lateral, and bilateral oblique views were obtained. There are 5 non-rib-bearing lumbar type vertebral bodies. There is no fracture or spondylolisthesis. Disc spaces appear intact. There is no appreciable facet arthropathy.  IMPRESSION: No fracture or appreciable arthropathy.   Electronically Signed   By: Lowella Grip M.D.   On: 02/12/2014 14:24   Dg Hip Complete Right  02/12/2014   CLINICAL DATA:  Pain post trauma  EXAM: RIGHT HIP - COMPLETE 2+ VIEW   COMPARISON:  None.  FINDINGS: Frontal pelvis as well as frontal and lateral right hip images were obtained. There is no fracture or dislocation. Joint spaces appear intact. No erosive change.  IMPRESSION: No abnormality noted.   Electronically Signed   By: Lowella Grip M.D.   On: 02/12/2014 14:23     EKG Interpretation None      MDM   Final diagnoses:  Fall  Contusion of hip, right  Lumbar strain    After she was treated with oral pain medication the patient endorses having significant improvement  with her pain and no longer walks with a limp. xrays are reassuring.  33 y.o.Orma [place]'s  with back pain. No neurological deficits and normal neuro exam. Patient can walk. No loss of bowel or bladder control. No concern for cauda equina.RICE protocol and pain medicine indicated and discussed with patient.   Patient Plan 1. Medications: narcotic pain medication, muscle relaxer and usual home medications  2. Treatment: rest, drink plenty of fluids, gentle stretching as discussed, alternate ice and heat  3. Follow Up: Please followup with your primary doctor for discussion of your diagnoses and further evaluation after today's visit; if you do not have a primary care doctor use the resource guide provided to find one   Vital signs are stable at discharge. Filed Vitals:   02/12/14 1444  BP: 120/72  Pulse: 73  Temp: 98.2 F (36.8 C)  Resp: 14    Patient/guardian has voiced understanding and agreed to follow-up with the PCP or specialist.   I personally performed the services described in this documentation, which was scribed in my presence. The recorded information has been reviewed and is accurate.   Linus Mako, PA-C 02/12/14 1845

## 2014-02-12 NOTE — ED Provider Notes (Signed)
Medical screening examination/treatment/procedure(s) were performed by non-physician practitioner and as supervising physician I was immediately available for consultation/collaboration.   EKG Interpretation None        Blanchard Kelch, MD 02/12/14 2201

## 2014-09-24 ENCOUNTER — Other Ambulatory Visit (HOSPITAL_COMMUNITY): Payer: Self-pay | Admitting: Internal Medicine

## 2014-09-24 ENCOUNTER — Ambulatory Visit (HOSPITAL_COMMUNITY)
Admission: RE | Admit: 2014-09-24 | Discharge: 2014-09-24 | Disposition: A | Discharge status: Home / Self Care | Payer: Medicaid Other | Source: Ambulatory Visit | Attending: Internal Medicine | Admitting: Internal Medicine

## 2014-09-24 DIAGNOSIS — W19XXXA Unspecified fall, initial encounter: Secondary | ICD-10-CM | POA: Diagnosis not present

## 2014-09-24 DIAGNOSIS — M25562 Pain in left knee: Secondary | ICD-10-CM | POA: Diagnosis not present

## 2014-09-24 DIAGNOSIS — S8992XA Unspecified injury of left lower leg, initial encounter: Secondary | ICD-10-CM

## 2015-02-06 ENCOUNTER — Encounter (HOSPITAL_COMMUNITY): Payer: Self-pay | Admitting: *Deleted

## 2015-02-06 ENCOUNTER — Emergency Department (HOSPITAL_COMMUNITY)
Admission: EM | Admit: 2015-02-06 | Discharge: 2015-02-07 | Disposition: A | Discharge status: Home / Self Care | Payer: Medicaid Other | Attending: Emergency Medicine | Admitting: Emergency Medicine

## 2015-02-06 DIAGNOSIS — I10 Essential (primary) hypertension: Secondary | ICD-10-CM | POA: Insufficient documentation

## 2015-02-06 DIAGNOSIS — Z87891 Personal history of nicotine dependence: Secondary | ICD-10-CM | POA: Insufficient documentation

## 2015-02-06 DIAGNOSIS — Z8742 Personal history of other diseases of the female genital tract: Secondary | ICD-10-CM | POA: Diagnosis not present

## 2015-02-06 DIAGNOSIS — Z8639 Personal history of other endocrine, nutritional and metabolic disease: Secondary | ICD-10-CM | POA: Insufficient documentation

## 2015-02-06 DIAGNOSIS — R509 Fever, unspecified: Secondary | ICD-10-CM | POA: Insufficient documentation

## 2015-02-06 LAB — CBC WITH DIFFERENTIAL/PLATELET
BASOS PCT: 0 % (ref 0–1)
Basophils Absolute: 0 10*3/uL (ref 0.0–0.1)
EOS ABS: 0 10*3/uL (ref 0.0–0.7)
Eosinophils Relative: 0 % (ref 0–5)
HEMATOCRIT: 38.2 % (ref 36.0–46.0)
HEMOGLOBIN: 12.2 g/dL (ref 12.0–15.0)
LYMPHS PCT: 20 % (ref 12–46)
Lymphs Abs: 1.5 10*3/uL (ref 0.7–4.0)
MCH: 24.5 pg — ABNORMAL LOW (ref 26.0–34.0)
MCHC: 31.9 g/dL (ref 30.0–36.0)
MCV: 76.9 fL — AB (ref 78.0–100.0)
MONO ABS: 0.7 10*3/uL (ref 0.1–1.0)
MONOS PCT: 9 % (ref 3–12)
NEUTROS ABS: 5.5 10*3/uL (ref 1.7–7.7)
Neutrophils Relative %: 71 % (ref 43–77)
Platelets: 298 10*3/uL (ref 150–400)
RBC: 4.97 MIL/uL (ref 3.87–5.11)
RDW: 14.3 % (ref 11.5–15.5)
WBC: 7.7 10*3/uL (ref 4.0–10.5)

## 2015-02-06 LAB — I-STAT CG4 LACTIC ACID, ED: Lactic Acid, Venous: 1.59 mmol/L (ref 0.5–2.0)

## 2015-02-06 MED ORDER — ACETAMINOPHEN 325 MG PO TABS
650.0000 mg | ORAL_TABLET | Freq: Four times a day (QID) | ORAL | Status: DC | PRN
  Administered 2015-02-06 (×2): 650 mg via ORAL

## 2015-02-06 MED ORDER — ACETAMINOPHEN 325 MG PO TABS
ORAL_TABLET | ORAL | Status: AC
  Administered 2015-02-06: 650 mg via ORAL
  Filled 2015-02-06: qty 2

## 2015-02-06 NOTE — ED Notes (Addendum)
Pt c/o fever (did not check at home) and dark urine. Concerned for UTI.

## 2015-02-07 LAB — COMPREHENSIVE METABOLIC PANEL
ALK PHOS: 75 U/L (ref 39–117)
ALT: 11 U/L (ref 0–35)
ANION GAP: 4 — AB (ref 5–15)
AST: 18 U/L (ref 0–37)
Albumin: 3.8 g/dL (ref 3.5–5.2)
BILIRUBIN TOTAL: 0.9 mg/dL (ref 0.3–1.2)
BUN: 6 mg/dL (ref 6–23)
CO2: 31 mmol/L (ref 19–32)
Calcium: 9.1 mg/dL (ref 8.4–10.5)
Chloride: 101 mmol/L (ref 96–112)
Creatinine, Ser: 0.93 mg/dL (ref 0.50–1.10)
GFR calc non Af Amer: 79 mL/min — ABNORMAL LOW (ref >90)
Glucose, Bld: 107 mg/dL — ABNORMAL HIGH (ref 70–99)
Potassium: 4.1 mmol/L (ref 3.5–5.1)
SODIUM: 136 mmol/L (ref 135–145)
Total Protein: 8.1 g/dL (ref 6.0–8.3)

## 2015-02-07 LAB — URINE MICROSCOPIC-ADD ON

## 2015-02-07 LAB — URINALYSIS, ROUTINE W REFLEX MICROSCOPIC
Bilirubin Urine: NEGATIVE
Glucose, UA: NEGATIVE mg/dL
Ketones, ur: NEGATIVE mg/dL
Leukocytes, UA: NEGATIVE
NITRITE: NEGATIVE
PROTEIN: NEGATIVE mg/dL
SPECIFIC GRAVITY, URINE: 1.005 (ref 1.005–1.030)
UROBILINOGEN UA: 0.2 mg/dL (ref 0.0–1.0)
pH: 6.5 (ref 5.0–8.0)

## 2015-02-07 NOTE — ED Provider Notes (Signed)
This chart was scribed for Media, DO by Delphia Grates, ED Scribe. This patient was seen in room A07C/A07C and the patient's care was started at 12:11 AM.  TIME SEEN: 0011  CHIEF COMPLAINT: Fever  HPI:  HPI Comments: [name] is a 34 y.o. female, with history of migraines, HLD, HTN, and leiomyoma of the uterus, who presents to the Emergency Department complaining of sudden onset fever (triage temp 99.7 F) onset yesterday (02/06/2015). Patient states she is unsure the cause of her fever. She reports taking her medications as prescribed prior to onset of symptoms. There is associated bilateral otalgia, but states this has resolved. She also notes decreased urinary output with dark colored urine and is concerned for possible UTI. Patient reports sick contacts with mother who was recently diagnosed with a "virus". She denies cough, sore throat, dysuria, hematuria, frequency, nausea, vomiting, diarrhea. LNMP on now. No sick contacts or recent travel.  Has had body aches.  Did not have influenza vaccine.  ROS: See HPI Constitutional: fever  Eyes: no drainage  ENT: no runny nose   Cardiovascular:  no chest pain  Resp: no SOB  GI: no vomiting GU: no dysuria Integumentary: no rash  Allergy: no hives  Musculoskeletal: no leg swelling  Neurological: no slurred speech ROS otherwise negative  PAST MEDICAL HISTORY/PAST SURGICAL HISTORY:  Past Medical History  Diagnosis Date  . Headache(784.0)     migraines - on daily meds  . Leiomyoma of uterus, unspecified 07/01/2011  . Hypertension   . Hyperlipidemia   . Migraines t    MEDICATIONS:  Prior to Admission medications   Medication Sig Start Date End Date Taking? Authorizing Provider  cyclobenzaprine (FLEXERIL) 10 MG tablet Take 1 tablet (10 mg total) by mouth 2 (two) times daily as needed for muscle spasms. 02/12/14   Tiffany Carlota Raspberry, PA-C  traMADol (ULTRAM) 50 MG tablet Take 1 tablet (50 mg total) by mouth every 6 (six) hours as  needed. 02/12/14   Delos Haring, PA-C    ALLERGIES:  No Known Allergies  SOCIAL HISTORY:  History  Substance Use Topics  . Smoking status: Former Smoker    Types: Cigarettes    Quit date: 06/08/2007  . Smokeless tobacco: Not on file  . Alcohol Use: No     Comment: couple of times a year    FAMILY HISTORY: Family History  Problem Relation Age of Onset  . Asthma Mother   . Diabetes Father   . Hypertension Father     EXAM:  Triage Vitals: BP 119/74 mmHg  Pulse 107  Temp(Src) 99.7 F (37.6 C) (Oral)  Resp 18  Ht 5\' 6"  (1.676 m)  Wt 240 lb (108.863 kg)  BMI 38.76 kg/m2  SpO2 98%  LMP 02/04/2015  CONSTITUTIONAL: Alert and oriented and responds appropriately to questions. Well-appearing; well-nourished. nontoxic HEAD: Normocephalic EYES: Conjunctivae clear, PERRL ENT: normal nose; no rhinorrhea; moist mucous membranes; pharynx without lesions noted. TMs clear bilaterally. No pharyngeal erythema. No tonsillar hypertrophy or exudate. NECK: Supple, no meningismus, no LAD  CARD: Regular and tachycardic; S1 and S2 appreciated; no murmurs, no clicks, no rubs, no gallops RESP: Normal chest excursion without splinting or tachypnea; breath sounds clear and equal bilaterally; no wheezes, no rhonchi, no rales, no hypoxia, speaking full sentences ABD/GI: Normal bowel sounds; non-distended; soft, non-tender, no rebound, no guarding BACK:  The back appears normal and is non-tender to palpation, there is no CVA tenderness EXT: Normal ROM in all joints; non-tender to palpation;  no edema; normal capillary refill; no cyanosis    SKIN: Normal color for age and race; warm. No rash NEURO: Moves all extremities equally PSYCH: The patient's mood and manner are appropriate. Grooming and personal hygiene are appropriate.  MEDICAL DECISION MAKING: Pt here with fever.  Labs normal including normal WBC, lactate.  Urine shows HB but pt menstruating.  Lungs clear and pt has no cough.  HR improved as  temp improved.  Tolerating water without difficulty.  Likely viral illness.  Will dc with instructions to rest, alternate Tylenol and Ibuprofen, drink plenty of water.  Discussed return precautions.  Pt verbalized understanding and is comfortable with plan.    I personally performed the services described in this documentation, which was scribed in my presence. The recorded information has been reviewed and is accurate.    [place], DO 02/07/15 1718

## 2015-02-07 NOTE — Discharge Instructions (Signed)
You may take Tylenol 1000 mg every 6 hours as needed for fever and pain and alternate with ibuprofen 800 mg every 8 hours as needed for fever and pain.     Viral Infections A viral infection can be caused by different types of viruses.Most viral infections are not serious and resolve on their own. However, some infections may cause severe symptoms and may lead to further complications. SYMPTOMS Viruses can frequently cause:  Minor sore throat.  Aches and pains.  Headaches.  Runny nose.  Different types of rashes.  Watery eyes.  Tiredness.  Cough.  Loss of appetite.  Gastrointestinal infections, resulting in nausea, vomiting, and diarrhea. These symptoms do not respond to antibiotics because the infection is not caused by bacteria. However, you might catch a bacterial infection following the viral infection. This is sometimes called a "superinfection." Symptoms of such a bacterial infection may include:  Worsening sore throat with pus and difficulty swallowing.  Swollen neck glands.  Chills and a high or persistent fever.  Severe headache.  Tenderness over the sinuses.  Persistent overall ill feeling (malaise), muscle aches, and tiredness (fatigue).  Persistent cough.  Yellow, green, or brown mucus production with coughing. HOME CARE INSTRUCTIONS   Only take over-the-counter or prescription medicines for pain, discomfort, diarrhea, or fever as directed by your caregiver.  Drink enough water and fluids to keep your urine clear or pale yellow. Sports drinks can provide valuable electrolytes, sugars, and hydration.  Get plenty of rest and maintain proper nutrition. Soups and broths with crackers or rice are fine. SEEK IMMEDIATE MEDICAL CARE IF:   You have severe headaches, shortness of breath, chest pain, neck pain, or an unusual rash.  You have uncontrolled vomiting, diarrhea, or you are unable to keep down fluids.  You or your child has an oral temperature  above 102 F (38.9 C), not controlled by medicine.  Your baby is older than 3 months with a rectal temperature of 102 F (38.9 C) or higher.  Your baby is 39 months old or younger with a rectal temperature of 100.4 F (38 C) or higher. MAKE SURE YOU:   Understand these instructions.  Will watch your condition.  Will get help right away if you are not doing well or get worse. Document Released: 08/03/2005 Document Revised: 01/16/2012 Document Reviewed: 02/28/2011 [facility] Patient Information 2015 [place], [place]. This information is not intended to replace advice given to you by your health care provider. Make sure you discuss any questions you have with your health care provider.

## 2015-02-08 LAB — URINE CULTURE

## 2015-02-13 LAB — CULTURE, BLOOD (ROUTINE X 2)
CULTURE: NO GROWTH
Culture: NO GROWTH

## 2017-09-14 ENCOUNTER — Ambulatory Visit: Payer: Self-pay | Admitting: Obstetrics and Gynecology

## 2017-10-05 ENCOUNTER — Ambulatory Visit: Payer: Medicaid Other | Admitting: Obstetrics and Gynecology

## 2017-10-05 ENCOUNTER — Encounter: Payer: Self-pay | Admitting: Obstetrics and Gynecology

## 2017-10-05 ENCOUNTER — Other Ambulatory Visit: Payer: Self-pay | Admitting: Obstetrics and Gynecology

## 2017-10-05 VITALS — BP 127/88 | HR 98 | Ht 67.0 in | Wt 256.2 lb

## 2017-10-05 DIAGNOSIS — D259 Leiomyoma of uterus, unspecified: Secondary | ICD-10-CM | POA: Diagnosis not present

## 2017-10-05 DIAGNOSIS — D250 Submucous leiomyoma of uterus: Secondary | ICD-10-CM

## 2017-10-05 MED ORDER — MEDROXYPROGESTERONE ACETATE 150 MG/ML IM SUSP: 150.0000 mg | INTRAMUSCULAR | 0 refills | Status: DC

## 2017-10-05 NOTE — Progress Notes (Signed)
Patient is in the office for GYN visit. Pt had u/s on 08-22-17 for fibroid and in in the office to follow up. U/s results are scanned into epic.

## 2017-10-05 NOTE — Progress Notes (Signed)
36 yo P1 here to discuss results of her 08/24/2017 ultrasound. Patient reports a monthly menses of 5 days. At times it is heavier. She denies any pelvic pain, back pain or pelvic pressure.   Past Medical History:  Diagnosis Date  . Headache(784.0)    migraines - on daily meds  . Hyperlipidemia   . Hypertension   . Leiomyoma of uterus, unspecified 07/01/2011  . Migraines t   Past Surgical History:  Procedure Laterality Date  . KNEE ARTHROSCOPY W/ ACL RECONSTRUCTION  2009  . KNEE ARTHROSCOPY W/ MENISCAL REPAIR  2009  . TONSILLECTOMY     AS A CHILD  . TUBAL LIGATION  2005  . UTERINE FIBROID SURGERY     Family History  Problem Relation Age of Onset  . Asthma Mother   . Diabetes Father   . Hypertension Father    Social History   Tobacco Use  . Smoking status: Former Smoker    Types: Cigarettes    Last attempt to quit: 06/08/2007    Years since quitting: 10.3  . Smokeless tobacco: Never Used  Substance Use Topics  . Alcohol use: No    Comment: couple of times a year  . Drug use: No   ROS See pertinent in HPI  Blood pressure 127/88, pulse 98, height 5\' 7"  (1.702 m), weight 256 lb 3.2 oz (116.2 kg). GENERAL: Well-developed, well-nourished female in no acute distress.  ABDOMEN: Soft, nontender, nondistended. Palpable fibroid uterus at the level of the umbilicus PELVIC: Not performed EXTREMITIES: No cyanosis, clubbing, or edema, 2+ distal pulses.  08/22/2017 ultrasound - uterus measuring 23 cm with multiple fibroids.  A/P 36 yo here for results and discuss management of fibroid uterus - Ultrasound results reviewed with the patient - Discussed surgical management with myomectomy or hysterectomy. Patient is reluctant to have surgery again - Discussed uterine artery embolization. Informed patient that she will have to be evaluated by radiologist to see if she is a candidate - In the meantime, patient opted to try medical management with depo-provera for cycle control - RTC  prn

## 2017-10-05 NOTE — Patient Instructions (Signed)
Uterine Artery Embolization for Fibroids Uterine artery embolization is a nonsurgical treatment to shrink fibroids. A thin plastic tube (catheter) is used to inject material that blocks off the blood supply to the fibroid, which causes the fibroid to shrink. Tell a health care provider about:  Any allergies you have.  All medicines you are taking, including vitamins, herbs, eye drops, creams, and over-the-counter medicines.  Any problems you or family members have had with anesthetic medicines.  Any blood disorders you have.  Any surgeries you have had.  Any medical conditions you have. What are the risks?  Injury to the uterus from decreased blood supply  Infection.  Blood infection (septicemia).  Lack of menstrual periods (amenorrhea).  Death of tissue cells (necrosis) around your bladder or vulva.  Development of a hole between organs or from an organ to the surface of your skin (fistula).  Blood clot in the legs (deep vein thrombosis) or lung (pulmonary embolus). What happens before the procedure?  Ask your health care provider about changing or stopping your regular medicines.  Do not take aspirin or blood thinners (anticoagulants) for 1 week before the surgery or as directed by your health care provider.  Do not eat or drink anything for 8 hours before the surgery or as directed by your health care provider.  Empty your bladder before the procedure begins. What happens during the procedure?  An IV tube will be placed into one of your veins. This will be used to give you a sedative and pain medication (conscious sedation).  You will be given a medicine that numbs the area (local anesthetic).  A small cut will be made in your groin. A catheter is then inserted into the main artery of your leg.  The catheter will be guided through the artery to your uterus. A series of images will be taken while dye is injected through the catheter in your groin. X-rays are taken at  the same time. This is done to provide a road map of the blood supply to your uterus and fibroids.  Tiny plastic spheres, about the size of sand grains, will be injected through the catheter. Metal coils may be used to help block the artery. The particles will lodge in tiny branches of the uterine artery that supplies blood to the fibroids.  The procedure is repeated on the artery that supplies the other side of the uterus.  The catheter is then removed and pressure is held to stop any bleeding. No stitches are needed.  A dressing is then placed over the cut (incision). What happens after the procedure?  You will be taken to a recovery area where your progress will be monitored until you are awake, stable, and taking fluids well. If there are no other problems, you will then be moved to a regular hospital room.  You will be observed overnight in the hospital.  You will have cramping that should be controlled with pain medication. This information is not intended to replace advice given to you by your health care provider. Make sure you discuss any questions you have with your health care provider. Document Released: 01/09/2006 Document Revised: 03/31/2016 Document Reviewed: 05/09/2013 Elsevier Interactive Patient Education  2018 Elsevier Inc.  

## 2017-10-11 ENCOUNTER — Other Ambulatory Visit: Payer: Medicaid Other

## 2017-10-12 ENCOUNTER — Ambulatory Visit (INDEPENDENT_AMBULATORY_CARE_PROVIDER_SITE_OTHER): Payer: Medicaid Other

## 2017-10-12 DIAGNOSIS — Z3042 Encounter for surveillance of injectable contraceptive: Secondary | ICD-10-CM | POA: Diagnosis not present

## 2017-10-12 DIAGNOSIS — Z3202 Encounter for pregnancy test, result negative: Secondary | ICD-10-CM | POA: Diagnosis not present

## 2017-10-12 LAB — POCT URINE PREGNANCY: Preg Test, Ur: NEGATIVE

## 2017-10-12 MED ORDER — MEDROXYPROGESTERONE ACETATE 150 MG/ML IM SUSP
150.0000 mg | INTRAMUSCULAR | Status: DC
  Administered 2017-10-12: 150 mg via INTRAMUSCULAR

## 2017-10-12 NOTE — Progress Notes (Signed)
Patient is in the office for depo injection.....admnistered and pt tolerated well .Marland Kitchen Administrations This Visit    medroxyPROGESTERone (DEPO-PROVERA) injection 150 mg    Admin Date 10/12/2017 Action Given Dose 150 mg Route Intramuscular Administered By Hinton Lovely, RN

## 2017-10-12 NOTE — Progress Notes (Signed)
I have reviewed this chart and agree with the RN/CMA assessment and management.    K. Meryl Trevonte Ashkar, M.D. Attending Obstetrician & Gynecologist, [facility], [facility]  

## 2017-10-12 NOTE — Addendum Note (Signed)
Addended by: Tristan Schroeder D on: 10/12/2017 10:57 AM   Modules accepted: Orders

## 2017-10-19 ENCOUNTER — Other Ambulatory Visit: Payer: Medicaid Other

## 2017-11-16 ENCOUNTER — Ambulatory Visit
Admission: RE | Admit: 2017-11-16 | Discharge: 2017-11-16 | Disposition: A | Discharge status: Home / Self Care | Payer: Medicaid Other | Source: Ambulatory Visit | Attending: Obstetrics and Gynecology | Admitting: Obstetrics and Gynecology

## 2017-11-16 ENCOUNTER — Encounter: Payer: Self-pay | Admitting: Radiology

## 2017-11-16 ENCOUNTER — Other Ambulatory Visit: Payer: Self-pay | Admitting: Interventional Radiology

## 2017-11-16 DIAGNOSIS — D250 Submucous leiomyoma of uterus: Secondary | ICD-10-CM

## 2017-11-16 HISTORY — PX: IR RADIOLOGIST EVAL & MGMT: IMG5224

## 2017-11-16 NOTE — Consult Note (Signed)
Chief Complaint: I have abnormal bleeding  Referring Physician(s): Constant,Peggy  History of Present Illness: [name] is a 37 y.o. female presenting today to Vascular & Interventional Radiology clinic as a scheduled consultation, kindly referred by Dr. Elly Modena, for evaluation for candidacy for Uterine Fibroid Embolization.    [name] tells me that she is having symptoms in all 3 categories related to uterine fibroids, including bleeding, pain, and bulk-type symptoms.  These have been worsening over the past year.  Bleeding is her worst symptom, with report of cycles every 21 days, with bleeding lasting 6-7 days, frequently changing her high-volume pads every 4 hours or so, with large clots.  She frequently experiences crampy pain during her cycles.   She reports urinary frequency.    She has been diagnosed with fibroids via [place] in 2012, and was treated at that time with myomectomy by a surgeon who is no longer her doctor, Dr. Chrissie Noa.  She reports temporary relief of symptoms for about 1 year or so, with no cycles at that time.  Then her symptoms worsened during 2018.    She was referred to Dr Elly Modena in December by her PCP, she tells me, is Dr. Oletta Lamas.    She recently received her first Depo shot on 10/12/2017 as medical treatment.    She is not married, and has one 46yo son.  She tells me that she has not ruled out further children at this time.  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    Past Medical History:  Diagnosis Date  . Headache(784.0)    migraines - on daily meds  . Hyperlipidemia   . Hypertension   . Leiomyoma of uterus, unspecified 07/01/2011  . Migraines t    Past Surgical History:  Procedure Laterality Date  . IR RADIOLOGIST EVAL & MGMT  11/16/2017  . KNEE ARTHROSCOPY W/ ACL RECONSTRUCTION  2009  . KNEE ARTHROSCOPY W/ MENISCAL REPAIR  2009  . TONSILLECTOMY     AS A CHILD  . TUBAL LIGATION  2005  . UTERINE FIBROID SURGERY        Allergies: Patient has no known allergies.  Medications: Prior to Admission medications   Medication Sig Start Date End Date Taking? Authorizing Provider  ALBUTEROL IN Inhale into the lungs.   Yes [provider]  cetirizine (ZYRTEC) 10 MG tablet Take 10 mg by mouth daily.   Yes [provider]  cyclobenzaprine (FLEXERIL) 10 MG tablet Take 1 tablet (10 mg total) by mouth 2 (two) times daily as needed for muscle spasms. 02/12/14  Yes Carlota Raspberry, Tiffany, PA-C  ibuprofen (ADVIL,MOTRIN) 800 MG tablet Take 800 mg by mouth every 8 (eight) hours as needed.   Yes [provider]  medroxyPROGESTERone (DEPO-PROVERA) 150 MG/ML injection Inject 1 mL (150 mg total) into the muscle every 3 (three) months. 10/05/17  Yes Constant, Peggy, MD  butalbital-acetaminophen-caffeine (FIORICET WITH CODEINE) 50-325-40-30 MG per capsule Take 1 capsule by mouth every 4 (four) hours as needed for headache.    [provider]  rosuvastatin (CRESTOR) 20 MG tablet Take 20 mg by mouth daily.    [provider]  simvastatin (ZOCOR) 20 MG tablet Take 20 mg by mouth daily.    [provider]  traMADol (ULTRAM) 50 MG tablet Take 1 tablet (50 mg total) by mouth every 6 (six) hours as needed. Patient not taking: Reported on 02/07/2015 02/12/14   Delos Haring, PA-C     Family  History  Problem Relation Age of Onset  . Asthma Mother   . Diabetes Father   . Hypertension Father     Social History   Socioeconomic History  . Marital status: Single    Spouse name: Not on file  . Number of children: Not on file  . Years of education: Not on file  . Highest education level: Not on file  Social Needs  . Financial resource strain: Not on file  . Food insecurity - worry: Not on file  . Food insecurity - inability: Not on file  . Transportation needs - medical: Not on file  . Transportation needs - non-medical: Not on file  Occupational History  . Not on file  Tobacco Use   . Smoking status: Former Smoker    Types: Cigarettes    Last attempt to quit: 06/08/2007    Years since quitting: 10.4  . Smokeless tobacco: Never Used  Substance and Sexual Activity  . Alcohol use: No    Comment: couple of times a year  . Drug use: No  . Sexual activity: Yes    Birth control/protection: Surgical  Other Topics Concern  . Not on file  Social History Narrative  . Not on file       Review of Systems: A 12 point ROS discussed and pertinent positives are indicated in the HPI above.  All other systems are negative.  Review of Systems  Vital Signs: BP 124/82   Pulse 83   Temp 98.9 F (37.2 C) (Oral)   Resp 16   Ht 5\' 7"  (1.702 m)   Wt 247 lb (112 kg)   LMP 10/10/2017   SpO2 100%   BMI 38.69 kg/m   Physical Exam  General: 37 yo female appearing  stated age.  Well-developed, well-nourished.  No distress. HEENT: Atraumatic, normocephalic.  Conjugate gaze, extra-ocular motor intact. No scleral icterus or scleral injection. Wearing glasses.  No lesions on external ears, nose, lips, or gums.  Oral mucosa moist, pink.  Neck: Symmetric with no goiter enlargement.  Chest/Lungs:  Symmetric chest with inspiration/expiration.  No labored breathing.  Clear to auscultation with no wheezes, rhonchi, or rales.  Heart:  RRR, with no third heart sounds appreciated. No JVD appreciated.  Abdomen:  Soft, NT/ND, with + bowel sounds.   Genito-urinary: Deferred Neurologic: Alert & Oriented to person, place, and time.   Normal affect and insight.  Appropriate questions.  Moving all 4 extremities with gross sensory intact.  Pulse Exam:  No bruit appreciated.   Palpable bilateral radial pulses.     Mallampati Score:  2  Imaging: Ir Radiologist Eval & Mgmt  Result Date: 11/16/2017 Please refer to notes tab for details about interventional procedure. (Op Note)   Labs:  CBC: No results for input(s): WBC, HGB, HCT, PLT in the last 8760 hours.  COAGS: No results for  input(s): INR, APTT in the last 8760 hours.  BMP: No results for input(s): NA, K, CL, CO2, GLUCOSE, BUN, CALCIUM, CREATININE, GFRNONAA, GFRAA in the last 8760 hours.  Invalid input(s): CMP  LIVER FUNCTION TESTS: No results for input(s): BILITOT, AST, ALT, ALKPHOS, PROT, ALBUMIN in the last 8760 hours.  TUMOR MARKERS: No results for input(s): AFPTM, CEA, CA199, CHROMGRNA in the last 8760 hours.  Assessment and Plan:  [name] is a 37 yo female with symptomatic uterine fibroids, with symptoms in all 3 categories, and bleeding as the most significant.    I do believe she is a good candidate  to consider for [place].    I had a lengthy discussion with her regarding the pathology/pathophysiology, anatomy of fibroids, and the 4 major treatment options -- medical, high-intensity focused [place] (HIFU), hysterectomy, and [place].  My discussion regarding [place] was a complete informed consent about the logistics of the treatment day, overnight observation, expectations regarding efficacy over the first 3, 6, and 12 months, our follow-up period, high-90% satisfaction rate (FIBROID registry), low repeat treatment rate, possible early menopause with inability to have further pregnancy, post-embolization syndrome, and the risk/benefit analysis.  Specific risks include: bleeding, infection, renal injury, contrast reaction, arterial injury, non-target embolization, need for further treatment/surgery which includes possible hysterectomy, cardiopulmonary collapse, death.  I answered all of her questions.   I also had a discussion with her about treating her either via common femoral or left radial approach.  I also discussed superior hypogastric nerve block as an option.   Specifically, I addressed her current indecision regarding further pregnancy.  I did tell her that there is a chance of non-target embolization which could provoke early menopause, and she may not be able to conceive in the future.  I also encouraged her  that this is not a definite, and there are certainly records of successful pregnancy after [place].  As one option that I shared, she could continue medical therapy until she has either had further pregnancy or decided against, and then proceed with therapy.  She dismissed this strategy, as she feels like the current symptoms are too disruptive.    She would like to proceed.   Plan - Schedule MRI - Plan for uterine artery embolization via either left radial or common femoral approach - She will let us know whether she would like superior hypogastric nerve block - I had advised her to maintain her follow up with her physicians.    Thank you for this interesting consult.  I greatly enjoyed meeting [name] and look forward to participating in their care.  A copy of this report was sent to the requesting provider on this date.  Electronically Signed: Corrie Mckusick 11/16/2017, 10:27 AM   I spent a total of  40 Minutes   in face to face in clinical consultation, greater than 50% of which was counseling/coordinating care for symptomatic uterine fibroids, with possible embolization, possible superior hypogastric nerve block.

## 2017-11-20 ENCOUNTER — Telehealth: Payer: Self-pay | Admitting: *Deleted

## 2017-11-20 ENCOUNTER — Other Ambulatory Visit: Payer: Self-pay

## 2017-11-20 ENCOUNTER — Telehealth: Payer: Self-pay

## 2017-11-20 MED ORDER — MEDROXYPROGESTERONE ACETATE 150 MG/ML IM SUSP: 150.0000 mg | INTRAMUSCULAR | 4 refills | Status: DC

## 2017-11-20 NOTE — Telephone Encounter (Signed)
-----   Message from Mora Bellman, MD sent at 11/20/2017 12:50 PM EST ----- Regarding: RE: surgery Contact: [phone] Please provide 4 refills on depo-provera. She needs to schedule an appointment to discuss her surgery before I am able to schedule it.  Admin pool: Please schedule surgical consult appointment with me, next available (not urgent). She needs to bring records of most recent pap smear. If she has not had one in the past 3 years, she should prepare to have one done when she comes for her consultation  Thanks  Peggy ----- Message ----- From: Tamela Oddi, RMA Sent: 11/20/2017  11:42 AM To: Mora Bellman, MD Subject: surgery                                        Ms. Canales, would like you to know that she does not agree with the Specialist and would prefer that you do her Surgery in April of this year.  She wants to talk to you.  She also requested refills for the DEPO, how many refills do you want me to send?  Thanks

## 2017-11-20 NOTE — Progress Notes (Signed)
4 refills ordered per Dr.

## 2017-11-20 NOTE — Telephone Encounter (Signed)
Ms. [name] called and states she has decided not to have the Mri or UFE procedure./vm

## 2017-11-20 NOTE — Telephone Encounter (Signed)
Patient notified

## 2017-11-23 ENCOUNTER — Encounter (HOSPITAL_COMMUNITY): Payer: Self-pay

## 2017-11-23 ENCOUNTER — Ambulatory Visit (HOSPITAL_COMMUNITY): Payer: Medicaid Other

## 2017-12-11 ENCOUNTER — Encounter: Payer: Self-pay | Admitting: Obstetrics and Gynecology

## 2017-12-11 ENCOUNTER — Ambulatory Visit: Payer: Medicaid Other | Admitting: Obstetrics and Gynecology

## 2017-12-11 ENCOUNTER — Encounter: Payer: Self-pay | Admitting: *Deleted

## 2017-12-11 VITALS — BP 129/84 | HR 96 | Ht 67.0 in | Wt 244.5 lb

## 2017-12-11 DIAGNOSIS — D259 Leiomyoma of uterus, unspecified: Secondary | ICD-10-CM | POA: Diagnosis not present

## 2017-12-11 NOTE — Progress Notes (Signed)
38 yo P1 here to discuss surgical management of her fibroid uterus. Patient is interested in having another child. She continues to experience a monthly 5-day period. She denies any pelvic pressure or back pain. She was seen by interventional radiologist and was found to be a good candidate. Given that the patient desires to preserve her fertility, she desires to be scheduled for a myomectomy.  Past Medical History:  Diagnosis Date  . Headache(784.0)    migraines - on daily meds  . Hyperlipidemia   . Hypertension   . Leiomyoma of uterus, unspecified 07/01/2011  . Migraines t   Past Surgical History:  Procedure Laterality Date  . IR RADIOLOGIST EVAL & MGMT  11/16/2017  . KNEE ARTHROSCOPY W/ ACL RECONSTRUCTION  2009  . KNEE ARTHROSCOPY W/ MENISCAL REPAIR  2009  . TONSILLECTOMY     AS A CHILD  . TUBAL LIGATION  2005  . UTERINE FIBROID SURGERY     Family History  Problem Relation Age of Onset  . Asthma Mother   . Diabetes Father   . Hypertension Father    Social History   Tobacco Use  . Smoking status: Former Smoker    Types: Cigarettes    Last attempt to quit: 06/08/2007    Years since quitting: 10.5  . Smokeless tobacco: Never Used  Substance Use Topics  . Alcohol use: No    Comment: couple of times a year  . Drug use: No   ROS See pertinent in HPI  Blood pressure 129/84, pulse 96, height 5\' 7"  (1.702 m), weight 244 lb 8 oz (110.9 kg), last menstrual period 12/04/2017. GENERAL: Well-developed, well-nourished female in no acute distress.  ABDOMEN: Soft, nontender, nondistended. Palpable fibroid uterus 4 cm above umbilicus PELVIC: Not performed EXTREMITIES: No cyanosis, clubbing, or edema, 2+ distal pulses.  11/2017 pap smear- negative  A/P 37 yo P1 here for surgical management of fibroid uterus - Upon review of her chart, patient had a bilateral tubal ligation. Informed patient that a myomectomy alone will not return her fertility. Offered to have patient meet with  infertility specialist to discuss tubal reversal and chance of fertility before performing myomectomy - Patient declined and opted for a hysterectomy. Informed patient that a hysterectomy will removed any chance of pregnancy. Patient verbalized understanding and wishes to proceed. Risks, benefits and alternatives were explained including but not limited to risks of bleeding, infection and damage to adjacent organs. Patient verbalized understanding and will be scheduled for a TAH with bilateral salpingectomy - Discussed the benefits of depo-lupron pre-op in decreasing uterine bulk. Patient agreed and understands that she will experience vasomotor symptoms for a short time period

## 2017-12-11 NOTE — Progress Notes (Signed)
Pt here for surgery consult for possible myomectomy

## 2017-12-12 ENCOUNTER — Encounter (HOSPITAL_COMMUNITY): Payer: Self-pay

## 2018-01-03 ENCOUNTER — Ambulatory Visit: Payer: Medicaid Other

## 2018-01-09 ENCOUNTER — Ambulatory Visit (INDEPENDENT_AMBULATORY_CARE_PROVIDER_SITE_OTHER): Payer: Medicaid Other

## 2018-01-09 DIAGNOSIS — D259 Leiomyoma of uterus, unspecified: Secondary | ICD-10-CM

## 2018-01-09 MED ORDER — LEUPROLIDE ACETATE (3 MONTH) 11.25 MG IM KIT
11.2500 mg | PACK | Freq: Once | INTRAMUSCULAR | Status: AC
  Administered 2018-01-09: 11.25 mg via INTRAMUSCULAR

## 2018-01-09 NOTE — Progress Notes (Signed)
Pt here for depo lupron injection. Inj given in left upper outer quad. Pt tolerated well

## 2018-01-29 ENCOUNTER — Encounter (HOSPITAL_COMMUNITY)
Admission: RE | Admit: 2018-01-29 | Discharge: 2018-01-29 | Disposition: A | Discharge status: Home / Self Care | Payer: Medicaid Other | Source: Ambulatory Visit | Attending: Obstetrics and Gynecology | Admitting: Obstetrics and Gynecology

## 2018-01-29 ENCOUNTER — Encounter (HOSPITAL_COMMUNITY): Payer: Self-pay

## 2018-01-29 ENCOUNTER — Other Ambulatory Visit: Payer: Self-pay

## 2018-01-29 DIAGNOSIS — G43909 Migraine, unspecified, not intractable, without status migrainosus: Secondary | ICD-10-CM

## 2018-01-29 DIAGNOSIS — Z01812 Encounter for preprocedural laboratory examination: Secondary | ICD-10-CM | POA: Insufficient documentation

## 2018-01-29 HISTORY — DX: Dyspnea, unspecified: R06.00

## 2018-01-29 HISTORY — DX: Anemia, unspecified: D64.9

## 2018-01-29 HISTORY — DX: Migraine, unspecified, not intractable, without status migrainosus: G43.909

## 2018-01-29 LAB — CBC
HCT: 38.9 % (ref 36.0–46.0)
HEMOGLOBIN: 12.9 g/dL (ref 12.0–15.0)
MCH: 25.7 pg — ABNORMAL LOW (ref 26.0–34.0)
MCHC: 33.2 g/dL (ref 30.0–36.0)
MCV: 77.6 fL — ABNORMAL LOW (ref 78.0–100.0)
PLATELETS: 280 10*3/uL (ref 150–400)
RBC: 5.01 MIL/uL (ref 3.87–5.11)
RDW: 15.9 % — ABNORMAL HIGH (ref 11.5–15.5)
WBC: 8 10*3/uL (ref 4.0–10.5)

## 2018-01-29 LAB — TYPE AND SCREEN
ABO/RH(D): O POS
Antibody Screen: NEGATIVE

## 2018-01-29 LAB — ABO/RH: ABO/RH(D): O POS

## 2018-01-29 NOTE — H&P (Signed)
[name] is an 37 y.o. female G1P1 here for the definitive treatment of symptomatic fibroid uterus. Patient with monthly 5-day period heavy in flow. She denies back pain or pelvic pressure/pain.   Pertinent Gynecological History: Menses: regular every month without intermenstrual spotting Bleeding: monthly Contraception: tubal ligation DES exposure: denies Blood transfusions: none Sexually transmitted diseases: no past history Last pap: normal Date: 31/2019 OB History: G1, P1   Menstrual History: Patient's last menstrual period was 01/25/2018 (exact date).    Past Medical History:  Diagnosis Date  . Anemia   . Dyspnea   . Headache(784.0)    migraines - on daily meds  . Hyperlipidemia   . Hypertension   . Leiomyoma of uterus, unspecified 07/01/2011  . Migraines t    Past Surgical History:  Procedure Laterality Date  . IR RADIOLOGIST EVAL & MGMT  11/16/2017  . KNEE ARTHROSCOPY W/ ACL RECONSTRUCTION  2009  . KNEE ARTHROSCOPY W/ MENISCAL REPAIR  2009  . TONSILLECTOMY     AS A CHILD  . TUBAL LIGATION  2005  . UTERINE FIBROID SURGERY      Family History  Problem Relation Age of Onset  . Asthma Mother   . Diabetes Father   . Hypertension Father     Social History:  reports that she quit smoking about 10 years ago. Her smoking use included cigarettes. She has never used smokeless tobacco. She reports that she does not drink alcohol or use drugs.  Allergies: No Known Allergies  Facility-Administered Medications Prior to Admission  Medication Dose Route Frequency Provider Last Rate Last Dose  . medroxyPROGESTERone (DEPO-PROVERA) injection 150 mg  150 mg Intramuscular Q90 days Abrey Bradway, MD   150 mg at 10/12/17 1025   Medications Prior to Admission  Medication Sig Dispense Refill Last Dose  . albuterol (PROVENTIL HFA;VENTOLIN HFA) 108 (90 Base) MCG/ACT inhaler Inhale 1-2 puffs into the lungs every 4 (four) hours as needed for wheezing or shortness of breath.    01/30/2018 at 0745  . cetirizine (ZYRTEC) 10 MG tablet Take 10 mg by mouth daily.   Past Week at Unknown time  . ferrous sulfate 325 (65 FE) MG tablet Take 325 mg by mouth daily with breakfast.   01/29/2018 at Unknown time  . ibuprofen (ADVIL,MOTRIN) 800 MG tablet Take 800 mg by mouth every 8 (eight) hours as needed for mild pain or moderate pain.    01/29/2018 at Unknown time  . Leuprolide Acetate, 3 Month, (LUPRON DEPOT, 12-MONTH, IM) Inject into the muscle every 3 (three) months. Received January 08, 2018   Past Month at Unknown time  . rosuvastatin (CRESTOR) 20 MG tablet Take 20 mg by mouth daily.   01/29/2018 at Unknown time    ROS See pertinent in HPI  Blood pressure (!) 139/94, pulse 83, temperature 98.5 F (36.9 C), temperature source Oral, resp. rate 16, last menstrual period 01/25/2018, SpO2 100 %. Physical Exam GENERAL: Well-developed, well-nourished female in no acute distress.  HEENT: Normocephalic, atraumatic. Sclerae anicteric.  NECK: Supple. Normal thyroid.  LUNGS: Clear to auscultation bilaterally.  HEART: Regular rate and rhythm. BREASTS: Symmetric in size. No palpable masses or lymphadenopathy, skin changes, or nipple drainage. ABDOMEN: Soft, nontender, nondistended. No organomegaly. PELVIC: Normal external female genitalia. Vagina is pink and rugated.  Normal discharge. Normal appearing cervix. Uterus is 24 weeks in size. No adnexal mass or tenderness. EXTREMITIES: No cyanosis, clubbing, or edema, 2+ distal pulses.  Results for orders placed or performed during the hospital encounter  of 01/29/18 (from the past 24 hour(s))  CBC     Status: Abnormal   Collection Time: 01/29/18  1:40 PM  Result Value Ref Range   WBC 8.0 4.0 - 10.5 K/uL   RBC 5.01 3.87 - 5.11 MIL/uL   Hemoglobin 12.9 12.0 - 15.0 g/dL   HCT 38.9 36.0 - 46.0 %   MCV 77.6 (L) 78.0 - 100.0 fL   MCH 25.7 (L) 26.0 - 34.0 pg   MCHC 33.2 30.0 - 36.0 g/dL   RDW 15.9 (H) 11.5 - 15.5 %   Platelets 280 150 - 400 K/uL   Type and screen     Status: None   Collection Time: 01/29/18  1:40 PM  Result Value Ref Range   ABO/RH(D) O POS    Antibody Screen NEG    Sample Expiration      02/01/2018 Performed at [facility], [address] [place], [place] [address]   ABO/Rh     Status: None   Collection Time: 01/29/18  1:40 PM  Result Value Ref Range   ABO/RH(D)      O POS Performed at [facility], [address], [place], [place] [address]     No results found.  Assessment/Plan: 37 yo G1P1 with symptomatic fibroid uterus here for definitive treatment with hysterectomy - Patient received depo-lupron on 3/6 - Risks, benefits and alternatives were explained including but not limited to risks of bleeding, infection and damage to adjacent organs. Patient verbalized understanding and all questions were answered - Patient agreed to abdominal hysterectomy with bilateral salpingectomy  Amiere Cawley 01/30/2018, 9:16 AM

## 2018-01-29 NOTE — Patient Instructions (Addendum)
Your procedure is scheduled on: Tuesday January 30, 2018 at 9:30 am  Enter through the Main Entrance of [facility] at: 8:00 am  Pick up the phone at the desk and dial [phone].  Call this number if you have problems the morning of surgery: [phone].  Remember: Do NOT eat food or drink any liquids after: Midnight on Monday March 25  Take these medicines the morning of surgery with a SIP OF WATER: None  Bring albuterol inhaler with you day of surgery  Do NOT wear jewelry (body piercing), metal hair clips/bobby pins, make-up, or nail polish. Do NOT wear lotions, powders, or perfumes.  You may wear deoderant. Do NOT shave for 48 hours prior to surgery. Do NOT bring valuables to the hospital. Contacts, dentures, or bridgework may not be worn into surgery. Leave suitcase in car.  After surgery it may be brought to your room.   For patients admitted to the hospital, checkout time is 11:00 AM the day of discharge.

## 2018-01-30 ENCOUNTER — Inpatient Hospital Stay (HOSPITAL_COMMUNITY): Payer: Medicaid Other | Admitting: Anesthesiology

## 2018-01-30 ENCOUNTER — Encounter (HOSPITAL_COMMUNITY)
Admission: RE | Disposition: A | Discharge status: Home / Self Care | Payer: Self-pay | Source: Ambulatory Visit | Attending: Obstetrics and Gynecology

## 2018-01-30 ENCOUNTER — Encounter (HOSPITAL_COMMUNITY): Payer: Self-pay

## 2018-01-30 ENCOUNTER — Inpatient Hospital Stay (HOSPITAL_COMMUNITY)
Admission: RE | Admit: 2018-01-30 | Discharge: 2018-02-01 | DRG: 743 | Disposition: A | Discharge status: Home / Self Care | Payer: Medicaid Other | Source: Ambulatory Visit | Attending: Obstetrics and Gynecology | Admitting: Obstetrics and Gynecology

## 2018-01-30 ENCOUNTER — Other Ambulatory Visit: Payer: Self-pay

## 2018-01-30 DIAGNOSIS — D259 Leiomyoma of uterus, unspecified: Secondary | ICD-10-CM | POA: Diagnosis present

## 2018-01-30 DIAGNOSIS — N920 Excessive and frequent menstruation with regular cycle: Secondary | ICD-10-CM | POA: Diagnosis present

## 2018-01-30 DIAGNOSIS — Z87891 Personal history of nicotine dependence: Secondary | ICD-10-CM

## 2018-01-30 DIAGNOSIS — Z6838 Body mass index (BMI) 38.0-38.9, adult: Secondary | ICD-10-CM

## 2018-01-30 DIAGNOSIS — Z90710 Acquired absence of both cervix and uterus: Secondary | ICD-10-CM | POA: Diagnosis present

## 2018-01-30 HISTORY — PX: HYSTERECTOMY ABDOMINAL WITH SALPINGECTOMY: SHX6725

## 2018-01-30 LAB — CBC
HCT: 37.7 % (ref 36.0–46.0)
Hemoglobin: 12.6 g/dL (ref 12.0–15.0)
MCH: 25.9 pg — AB (ref 26.0–34.0)
MCHC: 33.4 g/dL (ref 30.0–36.0)
MCV: 77.4 fL — AB (ref 78.0–100.0)
Platelets: 270 10*3/uL (ref 150–400)
RBC: 4.87 MIL/uL (ref 3.87–5.11)
RDW: 15.7 % — AB (ref 11.5–15.5)
WBC: 18.5 10*3/uL — ABNORMAL HIGH (ref 4.0–10.5)

## 2018-01-30 LAB — PREGNANCY, URINE: PREG TEST UR: NEGATIVE

## 2018-01-30 LAB — CREATININE, SERUM
Creatinine, Ser: 0.77 mg/dL (ref 0.44–1.00)
GFR calc Af Amer: >60 mL/min (ref >60)
GFR calc non Af Amer: >60 mL/min (ref >60)

## 2018-01-30 SURGERY — HYSTERECTOMY, TOTAL, ABDOMINAL, WITH SALPINGECTOMY
Anesthesia: General | Laterality: Bilateral

## 2018-01-30 MED ORDER — BUPIVACAINE HCL (PF) 0.25 % IJ SOLN
INTRAMUSCULAR | Status: DC | PRN
  Administered 2018-01-30: 30 mL

## 2018-01-30 MED ORDER — LIDOCAINE HCL (CARDIAC) 20 MG/ML IV SOLN
INTRAVENOUS | Status: DC | PRN
  Administered 2018-01-30: 50 mg via INTRAVENOUS

## 2018-01-30 MED ORDER — MIDAZOLAM HCL 2 MG/2ML IJ SOLN
INTRAMUSCULAR | Status: AC
  Filled 2018-01-30: qty 2

## 2018-01-30 MED ORDER — ENOXAPARIN SODIUM 40 MG/0.4ML ~~LOC~~ SOLN
40.0000 mg | SUBCUTANEOUS | Status: DC
  Administered 2018-01-31 – 2018-02-01 (×2): 40 mg via SUBCUTANEOUS
  Filled 2018-01-30 (×2): qty 0.4

## 2018-01-30 MED ORDER — FENTANYL CITRATE (PF) 250 MCG/5ML IJ SOLN
INTRAMUSCULAR | Status: AC
  Filled 2018-01-30: qty 5

## 2018-01-30 MED ORDER — ROCURONIUM BROMIDE 100 MG/10ML IV SOLN
INTRAVENOUS | Status: AC
  Filled 2018-01-30: qty 1

## 2018-01-30 MED ORDER — NALOXONE HCL 0.4 MG/ML IJ SOLN: 0.4000 mg | INTRAMUSCULAR | Status: DC | PRN

## 2018-01-30 MED ORDER — ONDANSETRON HCL 4 MG/2ML IJ SOLN
INTRAMUSCULAR | Status: AC
  Filled 2018-01-30: qty 2

## 2018-01-30 MED ORDER — HYDROMORPHONE HCL 1 MG/ML IJ SOLN
INTRAMUSCULAR | Status: AC
  Filled 2018-01-30: qty 0.5

## 2018-01-30 MED ORDER — CEFAZOLIN SODIUM-DEXTROSE 2-4 GM/100ML-% IV SOLN
2.0000 g | INTRAVENOUS | Status: AC
  Administered 2018-01-30: 2 g via INTRAVENOUS

## 2018-01-30 MED ORDER — ONDANSETRON HCL 4 MG/2ML IJ SOLN: 4.0000 mg | Freq: Four times a day (QID) | INTRAMUSCULAR | Status: DC | PRN

## 2018-01-30 MED ORDER — FENTANYL CITRATE (PF) 100 MCG/2ML IJ SOLN
INTRAMUSCULAR | Status: DC | PRN
  Administered 2018-01-30 (×3): 100 ug via INTRAVENOUS
  Administered 2018-01-30 (×4): 50 ug via INTRAVENOUS

## 2018-01-30 MED ORDER — HYDROMORPHONE HCL 1 MG/ML IJ SOLN
0.2500 mg | INTRAMUSCULAR | Status: DC | PRN
  Administered 2018-01-30 (×3): 0.5 mg via INTRAVENOUS

## 2018-01-30 MED ORDER — SCOPOLAMINE 1 MG/3DAYS TD PT72
1.0000 | MEDICATED_PATCH | Freq: Once | TRANSDERMAL | Status: DC
  Administered 2018-01-30: 1.5 mg via TRANSDERMAL

## 2018-01-30 MED ORDER — MIDAZOLAM HCL 2 MG/2ML IJ SOLN
INTRAMUSCULAR | Status: DC | PRN
  Administered 2018-01-30: 2 mg via INTRAVENOUS

## 2018-01-30 MED ORDER — HYDROMORPHONE HCL 1 MG/ML IJ SOLN
INTRAMUSCULAR | Status: AC
  Administered 2018-01-30: 0.5 mg via INTRAVENOUS
  Filled 2018-01-30: qty 1

## 2018-01-30 MED ORDER — LACTATED RINGERS IV SOLN
INTRAVENOUS | Status: DC
  Administered 2018-01-30 – 2018-01-31 (×2): via INTRAVENOUS

## 2018-01-30 MED ORDER — SCOPOLAMINE 1 MG/3DAYS TD PT72
MEDICATED_PATCH | TRANSDERMAL | Status: AC
  Administered 2018-01-30: 1.5 mg via TRANSDERMAL
  Filled 2018-01-30: qty 1

## 2018-01-30 MED ORDER — PROPOFOL 10 MG/ML IV BOLUS
INTRAVENOUS | Status: AC
  Filled 2018-01-30: qty 20

## 2018-01-30 MED ORDER — LACTATED RINGERS IV SOLN
INTRAVENOUS | Status: DC
  Administered 2018-01-30 (×3): via INTRAVENOUS

## 2018-01-30 MED ORDER — DIPHENHYDRAMINE HCL 12.5 MG/5ML PO ELIX: 12.5000 mg | ORAL_SOLUTION | Freq: Four times a day (QID) | ORAL | Status: DC | PRN

## 2018-01-30 MED ORDER — MEPERIDINE HCL 25 MG/ML IJ SOLN: 6.2500 mg | INTRAMUSCULAR | Status: DC | PRN

## 2018-01-30 MED ORDER — 0.9 % SODIUM CHLORIDE (POUR BTL) OPTIME
TOPICAL | Status: DC | PRN
  Administered 2018-01-30: 2000 mL

## 2018-01-30 MED ORDER — LIDOCAINE HCL (CARDIAC) 20 MG/ML IV SOLN
INTRAVENOUS | Status: AC
Start: 2018-01-30 — End: 2018-01-30
  Filled 2018-01-30: qty 5

## 2018-01-30 MED ORDER — DEXAMETHASONE SODIUM PHOSPHATE 4 MG/ML IJ SOLN
INTRAMUSCULAR | Status: AC
  Filled 2018-01-30: qty 1

## 2018-01-30 MED ORDER — BUPIVACAINE HCL (PF) 0.25 % IJ SOLN
INTRAMUSCULAR | Status: AC
  Filled 2018-01-30: qty 30

## 2018-01-30 MED ORDER — SUGAMMADEX SODIUM 200 MG/2ML IV SOLN
INTRAVENOUS | Status: AC
  Filled 2018-01-30: qty 2

## 2018-01-30 MED ORDER — DEXAMETHASONE SODIUM PHOSPHATE 10 MG/ML IJ SOLN
INTRAMUSCULAR | Status: DC | PRN
  Administered 2018-01-30: 4 mg via INTRAVENOUS

## 2018-01-30 MED ORDER — ROSUVASTATIN CALCIUM 20 MG PO TABS
20.0000 mg | ORAL_TABLET | Freq: Every day | ORAL | Status: DC
  Administered 2018-01-31 – 2018-02-01 (×2): 20 mg via ORAL
  Filled 2018-01-30 (×2): qty 1

## 2018-01-30 MED ORDER — PROPOFOL 10 MG/ML IV BOLUS
INTRAVENOUS | Status: DC | PRN
  Administered 2018-01-30: 150 mg via INTRAVENOUS

## 2018-01-30 MED ORDER — OXYCODONE HCL 5 MG PO TABS: 5.0000 mg | ORAL_TABLET | Freq: Once | ORAL | Status: DC | PRN

## 2018-01-30 MED ORDER — HYDROMORPHONE 1 MG/ML IV SOLN
INTRAVENOUS | Status: DC
  Administered 2018-01-30: 13:00:00 via INTRAVENOUS
  Administered 2018-01-30: 2.4 mg via INTRAVENOUS
  Administered 2018-01-30 – 2018-01-31 (×2): 3 mg via INTRAVENOUS
  Administered 2018-01-31: 0.3 mg via INTRAVENOUS
  Filled 2018-01-30: qty 25

## 2018-01-30 MED ORDER — ONDANSETRON HCL 4 MG/2ML IJ SOLN
INTRAMUSCULAR | Status: DC | PRN
  Administered 2018-01-30: 4 mg via INTRAVENOUS

## 2018-01-30 MED ORDER — FERROUS SULFATE 325 (65 FE) MG PO TABS
325.0000 mg | ORAL_TABLET | Freq: Every day | ORAL | Status: DC
  Administered 2018-01-31 – 2018-02-01 (×2): 325 mg via ORAL
  Filled 2018-01-30 (×2): qty 1

## 2018-01-30 MED ORDER — SUGAMMADEX SODIUM 200 MG/2ML IV SOLN
INTRAVENOUS | Status: DC | PRN
  Administered 2018-01-30: 200 mg via INTRAVENOUS

## 2018-01-30 MED ORDER — ALBUTEROL SULFATE (2.5 MG/3ML) 0.083% IN NEBU: 3.0000 mL | INHALATION_SOLUTION | RESPIRATORY_TRACT | Status: DC | PRN

## 2018-01-30 MED ORDER — ROCURONIUM BROMIDE 100 MG/10ML IV SOLN
INTRAVENOUS | Status: DC | PRN
  Administered 2018-01-30: 50 mg via INTRAVENOUS
  Administered 2018-01-30: 10 mg via INTRAVENOUS

## 2018-01-30 MED ORDER — HYDROMORPHONE HCL 1 MG/ML IJ SOLN
INTRAMUSCULAR | Status: DC | PRN
  Administered 2018-01-30: 1 mg via INTRAVENOUS

## 2018-01-30 MED ORDER — OXYCODONE HCL 5 MG/5ML PO SOLN: 5.0000 mg | Freq: Once | ORAL | Status: DC | PRN

## 2018-01-30 MED ORDER — HYDROMORPHONE HCL 1 MG/ML IJ SOLN
INTRAMUSCULAR | Status: AC
Start: 2018-01-30 — End: 2018-01-30
  Filled 2018-01-30: qty 1

## 2018-01-30 MED ORDER — DIPHENHYDRAMINE HCL 50 MG/ML IJ SOLN: 12.5000 mg | Freq: Four times a day (QID) | INTRAMUSCULAR | Status: DC | PRN

## 2018-01-30 MED ORDER — PROMETHAZINE HCL 25 MG/ML IJ SOLN: 6.2500 mg | INTRAMUSCULAR | Status: DC | PRN

## 2018-01-30 MED ORDER — SODIUM CHLORIDE 0.9% FLUSH: 9.0000 mL | INTRAVENOUS | Status: DC | PRN

## 2018-01-30 SURGICAL SUPPLY — 40 items
BRR ADH 6X5 SEPRAFILM 1 SHT (MISCELLANEOUS)
CANISTER SUCT 3000ML PPV (MISCELLANEOUS) ×2 IMPLANT
CONT PATH 16OZ SNAP LID 3702 (MISCELLANEOUS) ×2 IMPLANT
DECANTER SPIKE VIAL GLASS SM (MISCELLANEOUS) IMPLANT
DRAPE CESAREAN BIRTH W POUCH (DRAPES) ×2 IMPLANT
DRAPE WARM FLUID 44X44 (DRAPE) IMPLANT
DRSG OPSITE POSTOP 4X10 (GAUZE/BANDAGES/DRESSINGS) ×2 IMPLANT
DURAPREP 26ML APPLICATOR (WOUND CARE) ×2 IMPLANT
GAUZE SPONGE 4X4 16PLY XRAY LF (GAUZE/BANDAGES/DRESSINGS) ×2 IMPLANT
GLOVE BIOGEL PI IND STRL 6.5 (GLOVE) ×1 IMPLANT
GLOVE BIOGEL PI IND STRL 7.0 (GLOVE) ×2 IMPLANT
GLOVE BIOGEL PI INDICATOR 6.5 (GLOVE) ×1
GLOVE BIOGEL PI INDICATOR 7.0 (GLOVE) ×2
GLOVE SURG SS PI 6.0 STRL IVOR (GLOVE) ×2 IMPLANT
GOWN STRL REUS W/TWL LRG LVL3 (GOWN DISPOSABLE) ×6 IMPLANT
HEMOSTAT ARISTA ABSORB 3G PWDR (MISCELLANEOUS) ×1 IMPLANT
NEEDLE HYPO 22GX1.5 SAFETY (NEEDLE) ×2 IMPLANT
NS IRRIG 1000ML POUR BTL (IV SOLUTION) ×2 IMPLANT
PACK ABDOMINAL GYN (CUSTOM PROCEDURE TRAY) ×2 IMPLANT
PAD ABD 8X7 1/2 STERILE (GAUZE/BANDAGES/DRESSINGS) ×2 IMPLANT
PAD OB MATERNITY 4.3X12.25 (PERSONAL CARE ITEMS) ×2 IMPLANT
PENCIL SMOKE EVAC W/HOLSTER (ELECTROSURGICAL) ×2 IMPLANT
PROTECTOR NERVE ULNAR (MISCELLANEOUS) ×4 IMPLANT
SEPRAFILM MEMBRANE 5X6 (MISCELLANEOUS) IMPLANT
SPONGE GAUZE 4X4 12PLY STER LF (GAUZE/BANDAGES/DRESSINGS) ×2 IMPLANT
SPONGE LAP 18X18 X RAY DECT (DISPOSABLE) ×1 IMPLANT
SUT PLAIN 2 0 (SUTURE) ×2
SUT PLAIN ABS 2-0 CT1 27XMFL (SUTURE) IMPLANT
SUT VIC AB 0 CT1 18XCR BRD8 (SUTURE) ×2 IMPLANT
SUT VIC AB 0 CT1 36 (SUTURE) ×4 IMPLANT
SUT VIC AB 0 CT1 8-18 (SUTURE) ×4
SUT VIC AB 2-0 CT1 27 (SUTURE) ×2
SUT VIC AB 2-0 CT1 TAPERPNT 27 (SUTURE) IMPLANT
SUT VIC AB 2-0 SH 27 (SUTURE) ×2
SUT VIC AB 2-0 SH 27XBRD (SUTURE) IMPLANT
SUT VIC AB 4-0 KS 27 (SUTURE) ×2 IMPLANT
SUT VICRYL 0 TIES 12 18 (SUTURE) ×2 IMPLANT
SYR CONTROL 10ML LL (SYRINGE) ×2 IMPLANT
TOWEL OR 17X24 6PK STRL BLUE (TOWEL DISPOSABLE) ×4 IMPLANT
TRAY FOLEY CATH SILVER 14FR (SET/KITS/TRAYS/PACK) ×2 IMPLANT

## 2018-01-30 NOTE — Anesthesia Preprocedure Evaluation (Addendum)
Anesthesia Evaluation  Patient identified by MRN, date of birth, ID band Patient awake    Reviewed: Allergy & Precautions, H&P , NPO status , Patient's Chart, lab work & pertinent test results, reviewed documented beta blocker date and time   History of Anesthesia Complications Negative for: history of anesthetic complications  Airway Mallampati: I  TM Distance: >3 FB Neck ROM: full    Dental  (+) Teeth Intact   Pulmonary neg pulmonary ROS, former smoker,    breath sounds clear to auscultation       Cardiovascular Exercise Tolerance: Good hypertension, negative cardio ROS   Rhythm:regular Rate:Normal     Neuro/Psych  Headaches (migraines - daily), negative neurological ROS  negative psych ROS   GI/Hepatic negative GI ROS, Neg liver ROS,   Endo/Other  negative endocrine ROSMorbid obesity  Renal/GU negative Renal ROS  negative genitourinary   Musculoskeletal negative musculoskeletal ROS (+)   Abdominal (+) + obese,   Peds negative pediatric ROS (+)  Hematology negative hematology ROS (+)   Anesthesia Other Findings   Reproductive/Obstetrics negative OB ROS                            Anesthesia Physical  Anesthesia Plan  ASA: II  Anesthesia Plan: General   Post-op Pain Management:    Induction: Intravenous  PONV Risk Score and Plan: 3 and Ondansetron, Dexamethasone and Midazolam  Airway Management Planned: Oral ETT  Additional Equipment:   Intra-op Plan:   Post-operative Plan: Extubation in OR  Informed Consent: I have reviewed the patients History and Physical, chart, labs and discussed the procedure including the risks, benefits and alternatives for the proposed anesthesia with the patient or authorized representative who has indicated his/her understanding and acceptance.   Dental Advisory Given  Plan Discussed with: CRNA and Surgeon  Anesthesia Plan Comments:          Anesthesia Quick Evaluation

## 2018-01-30 NOTE — Anesthesia Procedure Notes (Signed)
Procedure Name: Intubation Date/Time: 01/30/2018 10:24 AM Performed by: Ignacia Bayley, CRNA Pre-anesthesia Checklist: Patient identified, Emergency Drugs available, Suction available and Patient being monitored Patient Re-evaluated:Patient Re-evaluated prior to induction Oxygen Delivery Method: Circle system utilized Preoxygenation: Pre-oxygenation with 100% oxygen Induction Type: IV induction Ventilation: Mask ventilation without difficulty Grade View: Grade II Tube type: Oral Tube size: 7.0 mm Number of attempts: 1 (intubation by SRNA) Airway Equipment and Method: Stylet Placement Confirmation: ETT inserted through vocal cords under direct vision,  positive ETCO2 and breath sounds checked- equal and bilateral Secured at: 20 cm Tube secured with: Tape Dental Injury: Teeth and Oropharynx as per pre-operative assessment

## 2018-01-30 NOTE — Anesthesia Postprocedure Evaluation (Signed)
Anesthesia Post Note  Patient: [name]  Procedure(s) Performed: HYSTERECTOMY ABDOMINAL WITH SALPINGECTOMY (Bilateral )     Patient location during evaluation: PACU Anesthesia Type: General Level of consciousness: awake and alert Pain management: pain level controlled Vital Signs Assessment: post-procedure vital signs reviewed and stable Respiratory status: spontaneous breathing, nonlabored ventilation and respiratory function stable Cardiovascular status: blood pressure returned to baseline and stable Postop Assessment: no apparent nausea or vomiting Anesthetic complications: no    Last Vitals:  Vitals:   01/30/18 1300 01/30/18 1312  BP: (!) 147/89   Pulse: 81   Resp: 18 16  Temp: 36.6 C   SpO2: 100% 100%    Last Pain:  Vitals:   01/30/18 1320  TempSrc:   PainSc: 6    Pain Goal: Patients Stated Pain Goal: 4 (01/30/18 1320)               Lynda Rainwater

## 2018-01-30 NOTE — Op Note (Signed)
[name] PROCEDURE DATE: 01/30/2018  PREOPERATIVE DIAGNOSIS:  37 y.o. G1P0101 with symptomatic fibroids, menorrhagia POSTOPERATIVE DIAGNOSIS:  Same SURGEON:   Mora Bellman, M.D. ASSISTANT:   Dr. Vivien Rota OPERATION:  Total abdominal hysterectomy with bilateral salpingectomy ANESTHESIA:  General endotracheal.  INDICATIONS: The patient is a 37 y.o. G1P0101 with history of symptomatic uterine fibroids/menorrhagia. The patient made a decision to undergo definite surgical treatment. On the preoperative visit, the risks, benefits, indications, and alternatives of the procedure were reviewed with the patient.  On the day of surgery, the risks of surgery were again discussed with the patient including but not limited to: bleeding which may require transfusion or reoperation; infection which may require antibiotics; injury to bowel, bladder, ureters or other surrounding organs; need for additional procedures; thromboembolic phenomenon, incisional problems and other postoperative/anesthesia complications. Written informed consent was obtained.    OPERATIVE FINDINGS: A 24-week size uterus with normal tubes and ovaries bilaterally.  ESTIMATED BLOOD LOSS: 600 ml FLUIDS:  2100 ml of Lactated Ringers URINE OUTPUT:  175 ml of clear yellow urine. SPECIMENS:  Uterus and bilateral tubes sent to pathology COMPLICATIONS:  None immediate.   DESCRIPTION OF PROCEDURE: The patient received intravenous antibiotics and had sequential compression devices applied to her lower extremities while in the preoperative area.   She was taken to the operating room where anesthesia was induced and found to be adequate. The patient was placed in supine position. The abdomen and perineum were prepped and draped in a sterile manner, and a Foley catheter was inserted into the bladder and attached to [name] drainage. After an adequate timeout was performed, a Pfannensteil skin incision was made. This incision was taken down  to the fascia using electrocautery with care given to maintain good hemostasis. The fascia was incised in the midline and the fascial incision was then extended bilaterally using mayo scissors. The fascia was then dissected off the underlying rectus muscles using blunt and sharp dissection. The rectus muscles were split bluntly in the midline and the peritoneum entered sharply without complication. This peritoneal incision was then extended superiorly and inferiorly with care given to prevent bowel or bladder injury. Upon entry into the abdominal cavity, the upper abdomen was inspected and found to be normal. Attention was then turned to the pelvis. A retractor was placed into the incision, and the bowel was packed away with moist laparotomy sponges. The uterus at this point was noted to be mobilized. The round ligaments on each side were clamped, suture ligated, and transected with electrocautery allowing entry into the broad ligament. The anterior and posterior leaves of the broad ligament were separated and the ureters were inspected to be safely away from the area of dissection bilaterally. A hole was created in the clear portion of the posterior broad ligament. Adnexae were clamped on the patient's right side. This pedicle was then clamped, cut, and doubly suture ligated with good hemostasis. This procedure was repeated in an identical fashion on the left site allowing for both adnexa to remain in place.  A bladder flap was then created across the anterior leaf of the broad ligament and the bladder was then bluntly dissected off the lower uterine segment and cervix with good hemostasis. The uterine arteries were then skeletonized bilaterally and then clamped, cut, and ligated with care given to prevent ureteral injury. The uterosacral ligaments were then clamped, cut, and ligated bilaterally. Finally, the cardinal ligaments were clamped, cut, and ligated bilaterally.  Acutely curved clamps were placed across  the vagina just under the cervix, and the specimen was amputated and sent to pathology. The vaginal cuff was closed with a series of interrupted 0 Vicryl figure-of-eight sutures with care given to incorporate the anterior pubocervical fascia and the posterior rectovaginal fascia.  The vaginal cuff angles were noted to have good hemostasis.  The pelvis was irrigated and hemostasis was reconfirmed at all pedicles and along the pelvic sidewall.  All laparotomy sponges and instruments were removed from the abdomen. The peritoneum was closed with 2-0 Vicryl, and the fascia was closed with 0 Vicryl in a running fashion. The subcutaneous layer was reapproximated with 2-0 plain gut. The skin was closed with a 4-0 Monocryl subcuticular stitch. Sponge, lap, needle, and instrument counts were correct times two. The patient was taken to the recovery area awake, extubated and in stable condition.

## 2018-01-30 NOTE — Transfer of Care (Signed)
Immediate Anesthesia Transfer of Care Note  Patient: [name]  Procedure(s) Performed: HYSTERECTOMY ABDOMINAL WITH SALPINGECTOMY (Bilateral )  Patient Location: PACU  Anesthesia Type:General  Level of Consciousness: awake  Airway & Oxygen Therapy: Patient Spontanous Breathing and Patient connected to nasal cannula oxygen  Post-op Assessment: Report given to RN and Post -op Vital signs reviewed and stable  Post vital signs: stable  Last Vitals:  Vitals Value Taken Time  BP 140/89 01/30/2018 12:00 PM  Temp    Pulse 94 01/30/2018 12:02 PM  Resp 19 01/30/2018 12:02 PM  SpO2 100 % 01/30/2018 12:02 PM  Vitals shown include unvalidated device data.  Last Pain:  Vitals:   01/30/18 0807  TempSrc: Oral         Complications: No apparent anesthesia complications

## 2018-01-31 ENCOUNTER — Encounter (HOSPITAL_COMMUNITY): Payer: Self-pay | Admitting: Obstetrics and Gynecology

## 2018-01-31 LAB — CBC
HCT: 34.4 % — ABNORMAL LOW (ref 36.0–46.0)
Hemoglobin: 11.3 g/dL — ABNORMAL LOW (ref 12.0–15.0)
MCH: 25.7 pg — ABNORMAL LOW (ref 26.0–34.0)
MCHC: 32.8 g/dL (ref 30.0–36.0)
MCV: 78.2 fL (ref 78.0–100.0)
PLATELETS: 272 10*3/uL (ref 150–400)
RBC: 4.4 MIL/uL (ref 3.87–5.11)
RDW: 15.9 % — ABNORMAL HIGH (ref 11.5–15.5)
WBC: 13.1 10*3/uL — ABNORMAL HIGH (ref 4.0–10.5)

## 2018-01-31 MED ORDER — OXYCODONE-ACETAMINOPHEN 5-325 MG PO TABS
2.0000 | ORAL_TABLET | Freq: Four times a day (QID) | ORAL | Status: DC | PRN
  Administered 2018-01-31 (×2): 2 via ORAL
  Filled 2018-01-31 (×2): qty 2

## 2018-01-31 MED ORDER — IBUPROFEN 600 MG PO TABS
600.0000 mg | ORAL_TABLET | Freq: Four times a day (QID) | ORAL | Status: DC
  Administered 2018-01-31 – 2018-02-01 (×3): 600 mg via ORAL
  Filled 2018-01-31 (×4): qty 1

## 2018-01-31 NOTE — Progress Notes (Signed)
1 Day Post-Op Procedure(s) (LRB): HYSTERECTOMY ABDOMINAL WITH SALPINGECTOMY (Bilateral)  Subjective: Patient reports feeling well. Her pain is well controlled with oral pain medication. She is ambulating, has been voiding and tolerated a regular diet. She denies chest pain, shortness of breath, lightheadedness/dizziness    Objective: I have reviewed patient's vital signs, intake and output, medications and labs. Vitals:   01/31/18 0548 01/31/18 0800  BP:  (!) 115/57  Pulse:  94  Resp: 18 18  Temp:  98.4 F (36.9 C)  SpO2: 100% 99%   GENERAL: Well-developed, well-nourished female in no acute distress.  LUNGS: Clear to auscultation bilaterally.  HEART: Regular rate and rhythm. ABDOMEN: Soft, non distended, appropriately tender, + BS Incision: honey comb dressing minimally stained. EXTREMITIES: No cyanosis, clubbing, or edema, 2+ distal pulses.  Assessment: s/p Procedure(s): HYSTERECTOMY ABDOMINAL WITH SALPINGECTOMY (Bilateral): stable  Plan: Advance diet Encourage ambulation Advance to PO medication Discontinue IV fluids  Plan for discharge home tomorrow  LOS: 1 day    Rosia Syme 01/31/2018, 11:19 AM

## 2018-02-01 MED ORDER — IBUPROFEN 600 MG PO TABS: 600.0000 mg | ORAL_TABLET | Freq: Four times a day (QID) | ORAL | 3 refills | Status: DC

## 2018-02-01 MED ORDER — OXYCODONE-ACETAMINOPHEN 5-325 MG PO TABS: 1.0000 | ORAL_TABLET | Freq: Four times a day (QID) | ORAL | 0 refills | Status: DC | PRN

## 2018-02-01 NOTE — Discharge Summary (Signed)
OB Discharge Summary     Patient Name: [name] DOB: 11/27/1980 MRN: [id number]  Date of admission: 01/30/2018  Date of discharge: 02/01/2018  Admitting diagnosis: FIBROIDS Intrauterine pregnancy: Unknown     Secondary diagnosis:  Active Problems:   S/P abdominal hysterectomy  Hospital course:  Patient admitted for scheduled abdominal hysterectomy due to symptomatic fibroid. Patient had an uncomplicated hysterectomy with bilateral salpingectomy where a fibroid uterus weighing 2000 gm was removed. Patient had an uncomplicated postoperative course. She ambulated, voided, passed flatus and tolerated a regular diet. She was found stable for discharge on post op days 2. Discharge instructions were reviewed with the patient.   Physical exam  Vitals:   01/31/18 1943 01/31/18 2357 02/01/18 0358 02/01/18 0800  BP: 140/70 113/79 115/65 127/72  Pulse: (!) 111 92 89 91  Resp: 18 18 18 18   Temp: 99.6 F (37.6 C) 98.6 F (37 C) 98.6 F (37 C) 99.4 F (37.4 C)  TempSrc: Oral Oral Oral Oral  SpO2: 100% 100% 100% 100%   General: alert, cooperative and no distress Abdomen: soft, non tender, non distended. Honeycomb dressing- clean dry and intact  DVT Evaluation: No evidence of DVT seen on physical exam. Negative Homan's sign. Labs: Lab Results  Component Value Date   WBC 13.1 (H) 01/31/2018   HGB 11.3 (L) 01/31/2018   HCT 34.4 (L) 01/31/2018   MCV 78.2 01/31/2018   PLT 272 01/31/2018   CMP Latest Ref Rng & Units 01/30/2018  Glucose 70 - 99 mg/dL -  BUN 6 - 23 mg/dL -  Creatinine 0.44 - 1.00 mg/dL 0.77  Sodium 135 - 145 mmol/L -  Potassium 3.5 - 5.1 mmol/L -  Chloride 96 - 112 mmol/L -  CO2 19 - 32 mmol/L -  Calcium 8.4 - 10.5 mg/dL -  Total Protein 6.0 - 8.3 g/dL -  Total Bilirubin 0.3 - 1.2 mg/dL -  Alkaline Phos 39 - 117 U/L -  AST 0 - 37 U/L -  ALT 0 - 35 U/L -    Discharge instruction: per After Visit Summary and "Baby and Me Booklet".  After visit meds:   Allergies as of 02/01/2018   No Known Allergies     Medication List    STOP taking these medications   LUPRON DEPOT (27-MONTH) IM     TAKE these medications   albuterol 108 (90 Base) MCG/ACT inhaler Commonly known as:  PROVENTIL HFA;VENTOLIN HFA Inhale 1-2 puffs into the lungs every 4 (four) hours as needed for wheezing or shortness of breath.   cetirizine 10 MG tablet Commonly known as:  ZYRTEC Take 10 mg by mouth daily.   ferrous sulfate 325 (65 FE) MG tablet Take 325 mg by mouth daily with breakfast.   ibuprofen 600 MG tablet Commonly known as:  ADVIL,MOTRIN Take 1 tablet (600 mg total) by mouth every 6 (six) hours. What changed:    medication strength  how much to take  when to take this  reasons to take this   oxyCODONE-acetaminophen 5-325 MG tablet Commonly known as:  PERCOCET/ROXICET Take 1 tablet by mouth every 6 (six) hours as needed for severe pain.   rosuvastatin 20 MG tablet Commonly known as:  CRESTOR Take 20 mg by mouth daily.       Diet: routine diet  Activity: Advance as tolerated. Pelvic rest for 6 weeks.   Follow up Appt: Future Appointments  Date Time Provider [place]  02/27/2018  1:30 PM Rashon Westrup, Vickii Chafe, MD  [facility] None     02/01/2018 Mora Bellman, MD

## 2018-02-01 NOTE — Discharge Instructions (Signed)
Abdominal Hysterectomy, Care After °This sheet gives you information about how to care for yourself after your procedure. Your health care provider may also give you more specific instructions. If you have problems or questions, contact your health care provider. °What can I expect after the procedure? °After your procedure, it is common to have: °· Pain. °· Fatigue. °· Poor appetite. °· Less interest in sex. °· Vaginal bleeding and discharge. You may need to use a sanitary napkin after this procedure. ° °Follow these instructions at home: °Bathing °· Do not take baths, swim, or use a hot tub until your health care provider approves. Ask your health care provider if you can take showers. You may only be allowed to take sponge baths for bathing. °· Keep the bandage (dressing) dry until your health care provider says it can be removed. °Incision care °· Follow instructions from your health care provider about how to take care of your incision. Make sure you: °? Wash your hands with soap and water before you change your bandage (dressing). If soap and water are not available, use hand sanitizer. °? Change your dressing as told by your health care provider. °? Leave stitches (sutures), skin glue, or adhesive strips in place. These skin closures may need to stay in place for 2 weeks or longer. If adhesive strip edges start to loosen and curl up, you may trim the loose edges. Do not remove adhesive strips completely unless your health care provider tells you to do that. °· Check your incision area every day for signs of infection. Check for: °? Redness, swelling, or pain. °? Fluid or blood. °? Warmth. °? Pus or a bad smell. °Activity °· Do gentle, daily exercises as told by your health care provider. You may be told to take short walks every day and go farther each time. °· Do not lift anything that is heavier than 10 lb (4.5 kg), or the limit that your health care provider tells you, until he or she says that it is  safe. °· Do not drive or use heavy machinery while taking prescription pain medicine. °· Do not drive for 24 hours if you were given a medicine to help you relax (sedative). °· Follow your health care provider's instructions about exercise, driving, and general activities. Ask your health care provider what activities are safe for you. °Lifestyle °· Do not douche, use tampons, or have sex for at least 6 weeks or as told by your health care provider. °· Do not drink alcohol until your health care provider approves. °· Drink enough fluid to keep your urine clear or pale yellow. °· Try to have someone at home with you for the first 1-2 weeks to help. °· Do not use any products that contain nicotine or tobacco, such as cigarettes and e-cigarettes. These can delay healing. If you need help quitting, ask your health care provider. °General instructions °· Take over-the-counter and prescription medicines only as told by your health care provider. °· Do not take aspirin or ibuprofen. These medicines can cause bleeding. °· To prevent or treat constipation while you are taking prescription pain medicine, your health care provider may recommend that you: °? Drink enough fluid to keep your urine clear or pale yellow. °? Take over-the-counter or prescription medicines. °? Eat foods that are high in fiber, such as fresh fruits and vegetables, whole grains, and beans. °? Limit foods that are high in fat and processed sugars, such as fried and sweet foods. °· Keep all   follow-up visits as told by your health care provider. This is important. °Contact a health care provider if: °· You have chills or fever. °· You have redness, swelling, or pain around your incision. °· You have fluid or blood coming from your incision. °· Your incision feels warm to the touch. °· You have pus or a bad smell coming from your incision. °· Your incision breaks open. °· You feel dizzy or light-headed. °· You have pain or bleeding when you urinate. °· You  have persistent diarrhea. °· You have persistent nausea and vomiting. °· You have abnormal vaginal discharge. °· You have a rash. °· You have any type of abnormal reaction or you develop an allergy to your medicine. °· Your pain medicine does not help. °Get help right away if: °· You have a fever and your symptoms suddenly get worse. °· You have severe abdominal pain. °· You have shortness of breath. °· You faint. °· You have pain, swelling, or redness in your leg. °· You have heavy vaginal bleeding with blood clots. °Summary °· After your procedure, it is common to have pain, fatigue and vaginal discharge. °· Do not take baths, swim, or use a hot tub until your health care provider approves. Ask your health care provider if you can take showers. You may only be allowed to take sponge baths for bathing. °· Follow your health care provider's instructions about exercise, driving, and general activities. Ask your health care provider what activities are safe for you. °· Do not lift anything that is heavier than 10 lb (4.5 kg), or the limit that your health care provider tells you, until he or she says that it is safe. °· Try to have someone at home with you for the first 1-2 weeks to help. °This information is not intended to replace advice given to you by your health care provider. Make sure you discuss any questions you have with your health care provider. °Document Released: 05/13/2005 Document Revised: 10/12/2016 Document Reviewed: 10/12/2016 °[employer]vier Interactive Patient Education © 2017 [employer] ° °

## 2018-02-01 NOTE — Plan of Care (Signed)
  Problem: Coping: Goal: Level of anxiety will decrease Outcome: Progressing   Problem: Elimination: Goal: Will not experience complications related to bowel motility Outcome: Progressing   

## 2018-02-01 NOTE — Progress Notes (Signed)
Pt out in wheelchair teaching complete  

## 2018-02-05 ENCOUNTER — Other Ambulatory Visit (HOSPITAL_COMMUNITY): Payer: Medicaid Other

## 2018-02-09 ENCOUNTER — Ambulatory Visit: Payer: Medicaid Other | Admitting: Certified Nurse Midwife

## 2018-02-09 ENCOUNTER — Encounter: Payer: Self-pay | Admitting: Certified Nurse Midwife

## 2018-02-09 VITALS — BP 129/86 | HR 94 | Ht 67.0 in | Wt 246.2 lb

## 2018-02-09 DIAGNOSIS — Z90710 Acquired absence of both cervix and uterus: Secondary | ICD-10-CM

## 2018-02-09 MED ORDER — AMOXICILLIN-POT CLAVULANATE 875-125 MG PO TABS: 1.0000 | ORAL_TABLET | Freq: Two times a day (BID) | ORAL | 0 refills | Status: DC

## 2018-02-09 MED ORDER — IBUPROFEN 800 MG PO TABS: 800.0000 mg | ORAL_TABLET | Freq: Three times a day (TID) | ORAL | 1 refills | Status: DC | PRN

## 2018-02-09 MED ORDER — CLINDAMYCIN HCL 300 MG PO CAPS
300.0000 mg | ORAL_CAPSULE | Freq: Three times a day (TID) | ORAL | 0 refills | Status: DC
Start: 2018-02-09 — End: 2018-02-27

## 2018-02-09 MED ORDER — OXYCODONE-ACETAMINOPHEN 5-325 MG PO TABS: 1.0000 | ORAL_TABLET | Freq: Four times a day (QID) | ORAL | 0 refills | Status: DC | PRN

## 2018-02-09 NOTE — Progress Notes (Signed)
Patient had hysterectomy on 01-30-18. Patient reports that when she urinated this morning a small amount of drainage came out of the left side of her incision, and there is a small hard nodule there.

## 2018-02-12 NOTE — Progress Notes (Signed)
Patient ID: [name], female   DOB: [birth year], 37 y.o.   MRN: [id number]  Chief Complaint  Patient presents with  . Wound Check    HPI [name] is a 37 y.o. female.  Patient woke up this morning in pain aroud 0500, noticed bloody drainage and a hard lump on the left side of her incision.  She had an abdominal hysterectomy on 01/30/18 d/t AUB with multiple fibroids.  Has been at home at her apartment recovering, recent started walking outside with stairs.  Reports taking motrin 600mg  around the clock with Percocet.  Pain is controlled with both medications.  Denies any fever, joint aches.  Does report using a pillow to support her abdomen with coughing.       HPI  Past Medical History:  Diagnosis Date  . Anemia   . Dyspnea   . Headache(784.0)    migraines - on daily meds  . Hyperlipidemia   . Hypertension   . Leiomyoma of uterus, unspecified 07/01/2011  . Migraines t    Past Surgical History:  Procedure Laterality Date  . HYSTERECTOMY ABDOMINAL WITH SALPINGECTOMY Bilateral 01/30/2018   Procedure: HYSTERECTOMY ABDOMINAL WITH SALPINGECTOMY;  Surgeon: Mora Bellman, MD;  Location: [facility];  Service: Gynecology;  Laterality: Bilateral;  . IR RADIOLOGIST EVAL & MGMT  11/16/2017  . KNEE ARTHROSCOPY W/ ACL RECONSTRUCTION  2009  . KNEE ARTHROSCOPY W/ MENISCAL REPAIR  2009  . TONSILLECTOMY     AS A CHILD  . TUBAL LIGATION  2005  . UTERINE FIBROID SURGERY      Family History  Problem Relation Age of Onset  . Asthma Mother   . Diabetes Father   . Hypertension Father     Social History Social History   Tobacco Use  . Smoking status: Former Smoker    Types: Cigarettes    Last attempt to quit: 06/08/2007    Years since quitting: 10.6  . Smokeless tobacco: Never Used  Substance Use Topics  . Alcohol use: No    Comment: couple of times a year  . Drug use: No    No Known Allergies  Current Outpatient Medications  Medication Sig Dispense Refill  . albuterol  (PROVENTIL HFA;VENTOLIN HFA) 108 (90 Base) MCG/ACT inhaler Inhale 1-2 puffs into the lungs every 4 (four) hours as needed for wheezing or shortness of breath.    . cetirizine (ZYRTEC) 10 MG tablet Take 10 mg by mouth daily.    . ferrous sulfate 325 (65 FE) MG tablet Take 325 mg by mouth daily with breakfast.    . oxyCODONE-acetaminophen (PERCOCET/ROXICET) 5-325 MG tablet Take 1 tablet by mouth every 6 (six) hours as needed for severe pain. 30 tablet 0  . rosuvastatin (CRESTOR) 20 MG tablet Take 20 mg by mouth daily.    Marland Kitchen amoxicillin-clavulanate (AUGMENTIN) 875-125 MG tablet Take 1 tablet by mouth 2 (two) times daily. 14 tablet 0  . clindamycin (CLEOCIN) 300 MG capsule Take 1 capsule (300 mg total) by mouth 3 (three) times daily. 21 capsule 0  . ibuprofen (ADVIL,MOTRIN) 800 MG tablet Take 1 tablet (800 mg total) by mouth every 8 (eight) hours as needed. 60 tablet 1   No current facility-administered medications for this visit.     Review of Systems Review of Systems Constitutional: negative for fatigue and weight loss Respiratory: negative for cough and wheezing Cardiovascular: negative for chest pain, fatigue and palpitations Gastrointestinal: negative for abdominal pain and change in bowel habits Genitourinary:negative Integument/breast: negative for nipple  discharge Musculoskeletal:negative for myalgias Neurological: negative for gait problems and tremors Behavioral/Psych: negative for abusive relationship, depression Endocrine: negative for temperature intolerance      Blood pressure 129/86, pulse 94, height 5\' 7"  (1.702 m), weight 246 lb 3.2 oz (111.7 kg), last menstrual period 01/25/2018.  Physical Exam Physical Exam General:   alert  Skin:   no rash or abnormalities  Lungs:   clear to auscultation bilaterally  Heart:   regular rate and rhythm, S1, S2 normal, no murmur, click, rub or gallop  Breasts:   normal without suspicious masses, skin or nipple changes or axillary nodes   Abdomen:  normal findings: no organomegaly, soft, non-tender and no hernia Wound: edges well approximated, non tender to palpation, no Pus or drainage obtained, no s/s infection on outer skin wall noted, does have small hematoma? Vs abscess noted on the left side. No drainage obtained with palpation on left side.  Steri-strips removed.    Pelvis:  External genitalia: normal general appearance Urinary system: urethral meatus normal and bladder without fullness, nontender Vaginal: normal without tenderness, induration or masses Cervix: normal appearance Adnexa: normal bimanual exam Uterus: anteverted and non-tender, normal size    50% of 20 min visit spent on counseling and coordination of care.   Data Reviewed Previous medical hx, meds  Assessment     ? Small hematoma vs abscess     Plan   POC discussed with Dr. Jodi Mourning   Antibiotics given in case of abscess under incision.  Pain medications refilled.   Meds ordered this encounter  Medications  . clindamycin (CLEOCIN) 300 MG capsule    Sig: Take 1 capsule (300 mg total) by mouth 3 (three) times daily.    Dispense:  21 capsule    Refill:  0  . amoxicillin-clavulanate (AUGMENTIN) 875-125 MG tablet    Sig: Take 1 tablet by mouth 2 (two) times daily.    Dispense:  14 tablet    Refill:  0  . ibuprofen (ADVIL,MOTRIN) 800 MG tablet    Sig: Take 1 tablet (800 mg total) by mouth every 8 (eight) hours as needed.    Dispense:  60 tablet    Refill:  1  . oxyCODONE-acetaminophen (PERCOCET/ROXICET) 5-325 MG tablet    Sig: Take 1 tablet by mouth every 6 (six) hours as needed for severe pain.    Dispense:  30 tablet    Refill:  0    Possible management options include:US/CT scan of abdomen if unimproved with antibiotics/rest  Follow up as needed, ED precautions given.  Has Post op appointment scheduled with Dr. Elly Modena on 02/27/18.

## 2018-02-24 ENCOUNTER — Other Ambulatory Visit: Payer: Self-pay | Admitting: Certified Nurse Midwife

## 2018-02-24 DIAGNOSIS — Z90710 Acquired absence of both cervix and uterus: Secondary | ICD-10-CM

## 2018-02-27 ENCOUNTER — Encounter: Payer: Self-pay | Admitting: Obstetrics and Gynecology

## 2018-02-27 ENCOUNTER — Other Ambulatory Visit (HOSPITAL_COMMUNITY)
Admission: RE | Admit: 2018-02-27 | Discharge: 2018-02-27 | Disposition: A | Discharge status: Home / Self Care | Payer: Medicaid Other | Source: Ambulatory Visit | Attending: Obstetrics and Gynecology | Admitting: Obstetrics and Gynecology

## 2018-02-27 ENCOUNTER — Ambulatory Visit: Payer: Medicaid Other | Admitting: Obstetrics and Gynecology

## 2018-02-27 VITALS — BP 142/83 | HR 98 | Ht 67.0 in | Wt 243.3 lb

## 2018-02-27 DIAGNOSIS — N760 Acute vaginitis: Secondary | ICD-10-CM | POA: Diagnosis present

## 2018-02-27 NOTE — Progress Notes (Signed)
37 yo G1P1 here for post op check. Patient is s/p TAH on 3/26 for the definitive treatment of symptomatic fibroid uterus. Patient reports doing well since her surgery and denies any fever, chills, abnormal drainage from her incision. Patient reports a return to her daily activity for the most part. Patient reports the presence of a foul smelling, non pruritic vaginal discharge and pressure with urination. These symptoms have been present for the past week  Past Medical History:  Diagnosis Date  . Anemia   . Dyspnea   . Headache(784.0)    migraines - on daily meds  . Hyperlipidemia   . Hypertension   . Leiomyoma of uterus, unspecified 07/01/2011  . Migraines t   Past Surgical History:  Procedure Laterality Date  . HYSTERECTOMY ABDOMINAL WITH SALPINGECTOMY Bilateral 01/30/2018   Procedure: HYSTERECTOMY ABDOMINAL WITH SALPINGECTOMY;  Surgeon: Mora Bellman, MD;  Location: [facility];  Service: Gynecology;  Laterality: Bilateral;  . IR RADIOLOGIST EVAL & MGMT  11/16/2017  . KNEE ARTHROSCOPY W/ ACL RECONSTRUCTION  2009  . KNEE ARTHROSCOPY W/ MENISCAL REPAIR  2009  . TONSILLECTOMY     AS A CHILD  . TUBAL LIGATION  2005  . UTERINE FIBROID SURGERY     Family History  Problem Relation Age of Onset  . Asthma Mother   . Diabetes Father   . Hypertension Father    Social History   Tobacco Use  . Smoking status: Former Smoker    Types: Cigarettes    Last attempt to quit: 06/08/2007    Years since quitting: 10.7  . Smokeless tobacco: Never Used  Substance Use Topics  . Alcohol use: No    Comment: couple of times a year  . Drug use: No   ROS See pertinent in HPI  Blood pressure (!) 142/83, pulse 98, height 5\' 7"  (1.702 m), weight 243 lb 4.8 oz (110.4 kg), last menstrual period 01/19/2018.  GENERAL: Well-developed, well-nourished female in no acute distress.  HEENT: Normocephalic, atraumatic. Sclerae anicteric.  NECK: Supple. Normal thyroid.  LUNGS: Clear to auscultation bilaterally.   HEART: Regular rate and rhythm. BREASTS: Symmetric in size. No palpable masses or lymphadenopathy, skin changes, or nipple drainage. ABDOMEN: Soft, nontender, nondistended. Incision healed PELVIC: Normal external female genitalia. Vagina is pink and rugated.  Normal discharge. Vaginal vault intact. No adnexal mass or tenderness. EXTREMITIES: No cyanosis, clubbing, or edema, 2+ distal pulses.  A/P 37 yo here for post op check s/p TAH for treatment of fibroid uterus - Pathology results reviewed with the patient - Patient is medically cleared to return to all activities of daily living - Wet prep collected to evaluate vaginitis - Urine culture collected - RTC in 1 year for annual exam - pap smear no longer indicared

## 2018-02-28 LAB — CERVICOVAGINAL ANCILLARY ONLY
BACTERIAL VAGINITIS: NEGATIVE
CANDIDA VAGINITIS: POSITIVE — AB

## 2018-02-28 MED ORDER — FLUCONAZOLE 150 MG PO TABS: 150.0000 mg | ORAL_TABLET | Freq: Once | ORAL | 0 refills | Status: AC

## 2018-02-28 NOTE — Addendum Note (Signed)
Addended by: Mora Bellman on: 02/28/2018 02:11 PM   Modules accepted: Orders

## 2018-03-01 LAB — URINE CULTURE

## 2018-05-22 ENCOUNTER — Other Ambulatory Visit: Payer: Self-pay | Admitting: Obstetrics and Gynecology

## 2018-06-21 ENCOUNTER — Other Ambulatory Visit: Payer: Self-pay | Admitting: Obstetrics and Gynecology

## 2019-03-07 ENCOUNTER — Other Ambulatory Visit: Payer: Self-pay | Admitting: Obstetrics and Gynecology

## 2019-07-08 ENCOUNTER — Other Ambulatory Visit: Payer: Self-pay | Admitting: Obstetrics and Gynecology

## 2020-10-21 ENCOUNTER — Other Ambulatory Visit: Payer: Self-pay | Admitting: Internal Medicine

## 2020-10-21 DIAGNOSIS — Z1231 Encounter for screening mammogram for malignant neoplasm of breast: Secondary | ICD-10-CM

## 2020-12-02 ENCOUNTER — Other Ambulatory Visit: Payer: Self-pay

## 2020-12-02 ENCOUNTER — Ambulatory Visit
Admission: RE | Admit: 2020-12-02 | Discharge: 2020-12-02 | Disposition: A | Discharge status: Home / Self Care | Payer: Medicaid Other | Source: Ambulatory Visit | Attending: Internal Medicine | Admitting: Internal Medicine

## 2020-12-02 DIAGNOSIS — Z1231 Encounter for screening mammogram for malignant neoplasm of breast: Secondary | ICD-10-CM

## 2021-10-25 ENCOUNTER — Other Ambulatory Visit: Payer: Self-pay | Admitting: Internal Medicine

## 2021-10-26 LAB — COMPLETE METABOLIC PANEL WITH GFR
AG Ratio: 1.3 (calc) (ref 1.0–2.5)
ALT: 8 U/L (ref 6–29)
AST: 14 U/L (ref 10–30)
Albumin: 4.4 g/dL (ref 3.6–5.1)
Alkaline phosphatase (APISO): 84 U/L (ref 31–125)
BUN: 11 mg/dL (ref 7–25)
CO2: 25 mmol/L (ref 20–32)
Calcium: 9.6 mg/dL (ref 8.6–10.2)
Chloride: 101 mmol/L (ref 98–110)
Creat: 0.93 mg/dL (ref 0.50–0.99)
Globulin: 3.5 g/dL (calc) (ref 1.9–3.7)
Glucose, Bld: 84 mg/dL (ref 65–99)
Potassium: 3.9 mmol/L (ref 3.5–5.3)
Sodium: 140 mmol/L (ref 135–146)
Total Bilirubin: 0.8 mg/dL (ref 0.2–1.2)
Total Protein: 7.9 g/dL (ref 6.1–8.1)
eGFR: 79 mL/min/{1.73_m2} (ref >=60)

## 2021-10-26 LAB — CBC
HCT: 39.3 % (ref 35.0–45.0)
Hemoglobin: 12.4 g/dL (ref 11.7–15.5)
MCH: 24.5 pg — ABNORMAL LOW (ref 27.0–33.0)
MCHC: 31.6 g/dL — ABNORMAL LOW (ref 32.0–36.0)
MCV: 77.7 fL — ABNORMAL LOW (ref 80.0–100.0)
MPV: 11.7 fL (ref 7.5–12.5)
Platelets: 301 10*3/uL (ref 140–400)
RBC: 5.06 10*6/uL (ref 3.80–5.10)
RDW: 13.7 % (ref 11.0–15.0)
WBC: 10.1 10*3/uL (ref 3.8–10.8)

## 2021-10-26 LAB — URINE CULTURE
MICRO NUMBER:: 12774279
SPECIMEN QUALITY:: ADEQUATE

## 2021-10-26 LAB — LIPID PANEL
Cholesterol: 242 mg/dL — ABNORMAL HIGH (ref <200)
HDL: 53 mg/dL (ref >=50)
LDL Cholesterol (Calc): 164 mg/dL (calc) — ABNORMAL HIGH
Non-HDL Cholesterol (Calc): 189 mg/dL (calc) — ABNORMAL HIGH (ref <130)
Total CHOL/HDL Ratio: 4.6 (calc) (ref <5.0)
Triglycerides: 123 mg/dL (ref <150)

## 2021-10-26 LAB — C. TRACHOMATIS/N. GONORRHOEAE RNA
C. trachomatis RNA, TMA: NOT DETECTED
N. gonorrhoeae RNA, TMA: NOT DETECTED

## 2021-10-26 LAB — VITAMIN D 25 HYDROXY (VIT D DEFICIENCY, FRACTURES): Vit D, 25-Hydroxy: 57 ng/mL (ref 30–100)

## 2021-11-02 ENCOUNTER — Other Ambulatory Visit: Payer: Self-pay | Admitting: Internal Medicine

## 2021-11-02 DIAGNOSIS — Z1231 Encounter for screening mammogram for malignant neoplasm of breast: Secondary | ICD-10-CM

## 2021-12-02 ENCOUNTER — Other Ambulatory Visit (HOSPITAL_COMMUNITY): Payer: Self-pay

## 2021-12-03 ENCOUNTER — Ambulatory Visit
Admission: RE | Admit: 2021-12-03 | Discharge: 2021-12-03 | Disposition: A | Discharge status: Home / Self Care | Payer: Medicaid Other | Source: Ambulatory Visit | Attending: Internal Medicine | Admitting: Internal Medicine

## 2021-12-03 DIAGNOSIS — Z1231 Encounter for screening mammogram for malignant neoplasm of breast: Secondary | ICD-10-CM

## 2021-12-21 ENCOUNTER — Ambulatory Visit (INDEPENDENT_AMBULATORY_CARE_PROVIDER_SITE_OTHER): Payer: Medicaid Other | Admitting: Obstetrics and Gynecology

## 2021-12-21 ENCOUNTER — Other Ambulatory Visit: Payer: Self-pay

## 2021-12-21 ENCOUNTER — Encounter: Payer: Self-pay | Admitting: Obstetrics and Gynecology

## 2021-12-21 VITALS — BP 153/93 | HR 93 | Wt 264.7 lb

## 2021-12-21 DIAGNOSIS — Z01419 Encounter for gynecological examination (general) (routine) without abnormal findings: Secondary | ICD-10-CM | POA: Diagnosis not present

## 2021-12-21 NOTE — Progress Notes (Signed)
Subjective:     [name] is a 41 y.o. female P1 who is here for a comprehensive physical exam. The patient reports no problems. Patient had a TAH secondary to fibroid uterus in 2019. She denies any episode of vaginal bleeding or pelvic pain. Patient with normal mammogram in January 2023. She denies any urinary incontinence. Patient is sexually active without complaints. Patient is without any other complaints  Past Medical History:  Diagnosis Date   Anemia    Dyspnea    Headache(784.0)    migraines - on daily meds   Hyperlipidemia    Hypertension    Leiomyoma of uterus, unspecified 07/01/2011   Migraines t   Past Surgical History:  Procedure Laterality Date   HYSTERECTOMY ABDOMINAL WITH SALPINGECTOMY Bilateral 01/30/2018   Procedure: HYSTERECTOMY ABDOMINAL WITH SALPINGECTOMY;  Surgeon: Mora Bellman, MD;  Location: [facility];  Service: Gynecology;  Laterality: Bilateral;   IR RADIOLOGIST EVAL & MGMT  11/16/2017   KNEE ARTHROSCOPY W/ ACL RECONSTRUCTION  2009   KNEE ARTHROSCOPY W/ MENISCAL REPAIR  2009   TONSILLECTOMY     AS A CHILD   TUBAL LIGATION  2005   UTERINE FIBROID SURGERY     Family History  Problem Relation Age of Onset   Asthma Mother    Diabetes Father    Hypertension Father    Breast cancer Maternal Aunt      Social History   Socioeconomic History   Marital status: Single    Spouse name: Not on file   Number of children: Not on file   Years of education: Not on file   Highest education level: Not on file  Occupational History   Not on file  Tobacco Use   Smoking status: Former    Types: Cigarettes    Quit date: 06/08/2007    Years since quitting: 14.5   Smokeless tobacco: Never  Vaping Use   Vaping Use: Never used  Substance and Sexual Activity   Alcohol use: No    Comment: couple of times a year   Drug use: No   Sexual activity: Yes    Birth control/protection: Surgical  Other Topics Concern   Not on file  Social History Narrative   Not on  file   Social Determinants of Health   Financial Resource Strain: Not on file  Food Insecurity: Not on file  Transportation Needs: Not on file  Physical Activity: Not on file  Stress: Not on file  Social Connections: Not on file  Intimate Partner Violence: Not on file   Health Maintenance  Topic Date Due   COVID-19 Vaccine (1) Never done   HIV Screening  Never done   Hepatitis C Screening  Never done   TETANUS/TDAP  05/31/2014   PAP SMEAR-Modifier  11/27/2020   INFLUENZA VACCINE  06/07/2021   HPV VACCINES  Aged Out       Review of Systems Pertinent items noted in HPI and remainder of comprehensive ROS otherwise negative.   Objective:  Blood pressure (!) 153/93, pulse 93, weight 264 lb 11.2 oz (120.1 kg), last menstrual period 01/25/2018.   GENERAL: Well-developed, well-nourished female in no acute distress.  HEENT: Normocephalic, atraumatic. Sclerae anicteric.  NECK: Supple. Normal thyroid.  LUNGS: Clear to auscultation bilaterally.  HEART: Regular rate and rhythm. BREASTS: Symmetric in size. No palpable masses or lymphadenopathy, skin changes, or nipple drainage. ABDOMEN: Soft, nontender, nondistended. No organomegaly. PELVIC: Normal external female genitalia. Vagina is pink and rugated.  Normal discharge. No adnexal  mass or tenderness. Chaperone present during the pelvic exam EXTREMITIES: No cyanosis, clubbing, or edema, 2+ distal pulses.     Assessment:    Healthy female exam.      Plan:    Pap smear no longer indicated Patient up to date with mammogram RTC in 1 year or prn Follow up with PCP regarding HTN See After Visit Summary for Counseling Recommendations

## 2022-01-03 ENCOUNTER — Other Ambulatory Visit: Payer: Self-pay | Admitting: Internal Medicine

## 2022-01-05 LAB — URINE CULTURE
MICRO NUMBER:: 13061476
SPECIMEN QUALITY:: ADEQUATE

## 2022-02-17 IMAGING — MG MM DIGITAL SCREENING BILAT W/ TOMO AND CAD
8 series · 8 of 24 positions shown · non-contrast
Comparison: Previous exam(s).

CLINICAL DATA: Screening.

EXAM:
DIGITAL SCREENING BILATERAL MAMMOGRAM WITH TOMOSYNTHESIS AND CAD
TECHNIQUE: Bilateral screening digital craniocaudal and mediolateral oblique
mammograms were obtained. Bilateral screening digital breast
tomosynthesis was performed. The images were evaluated with
computer-aided detection.

[R MLO synth-2D]
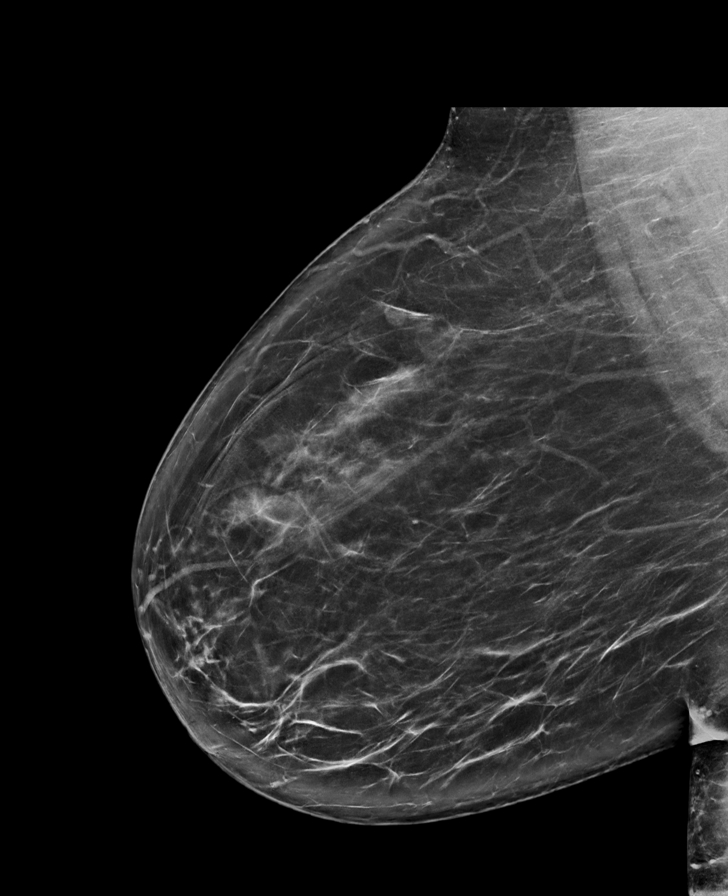

[L MLO synth-2D]
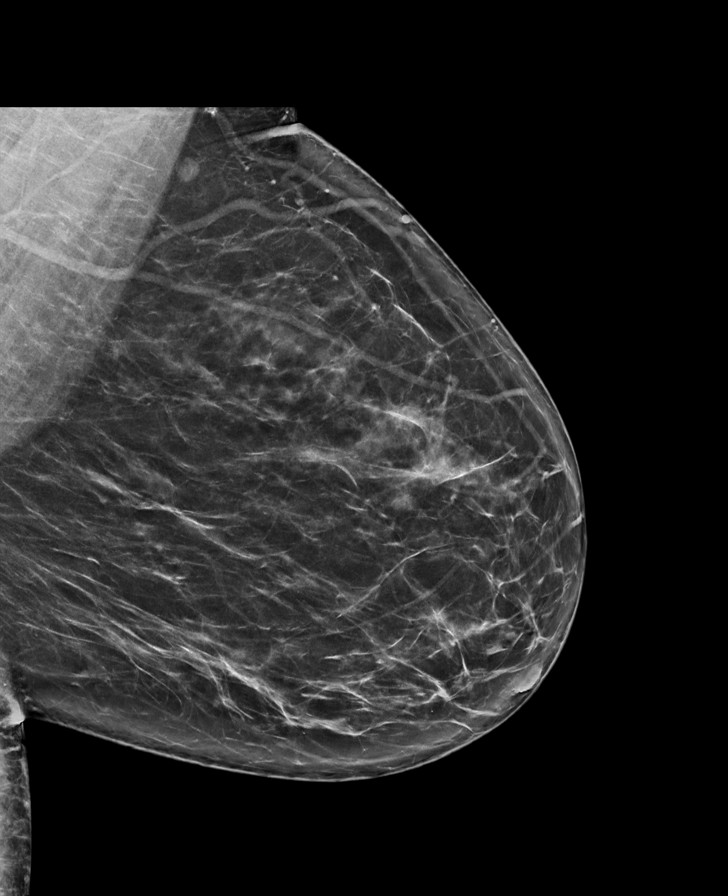

[L CC synth-2D]
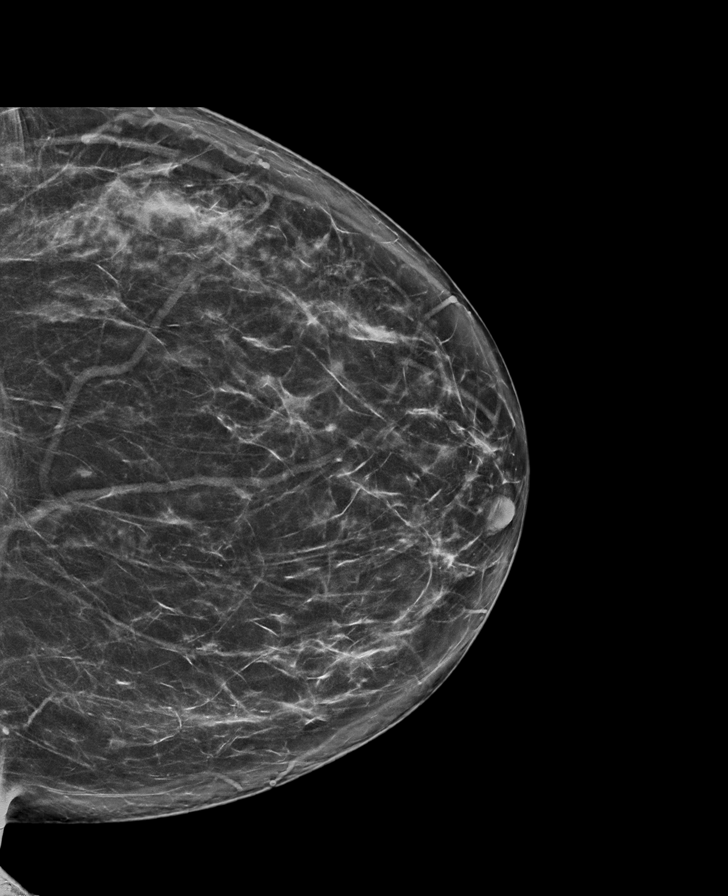

[R CC synth-2D]
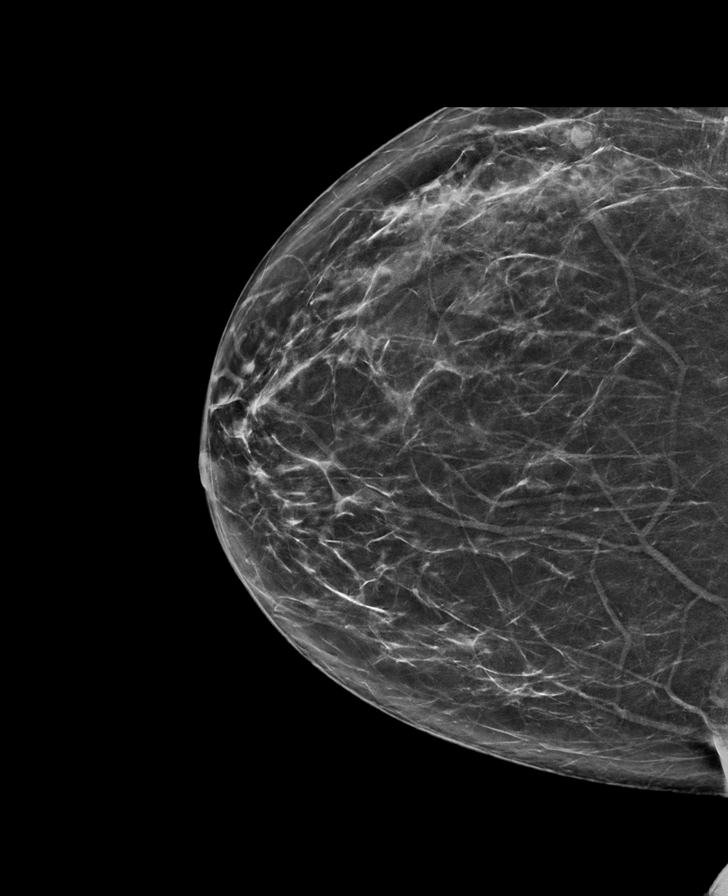

[R CC tomo · tomo slice 39/77.0]
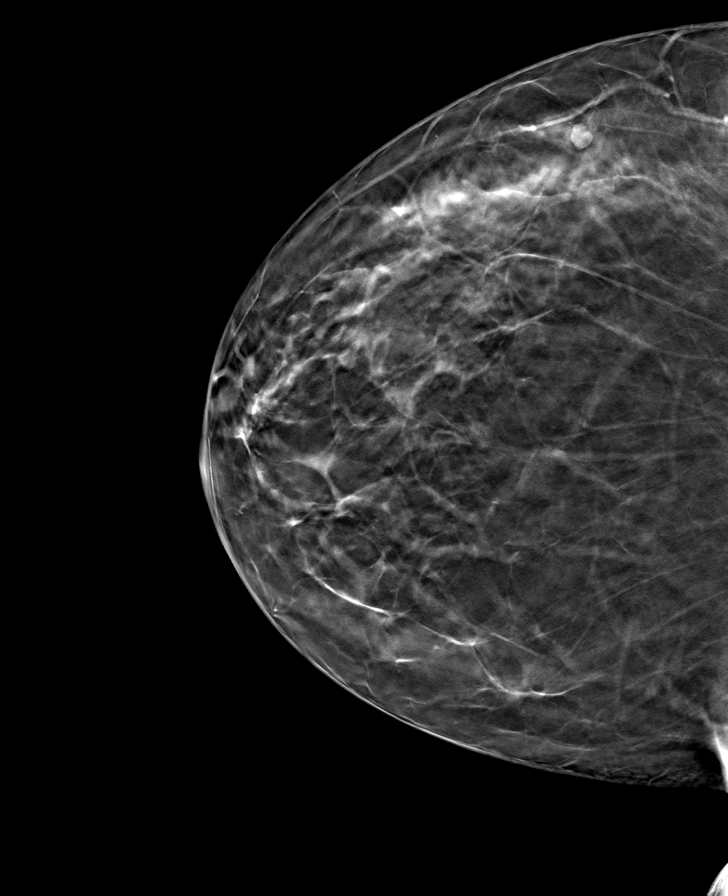

[L CC tomo · tomo slice 41/80.0]
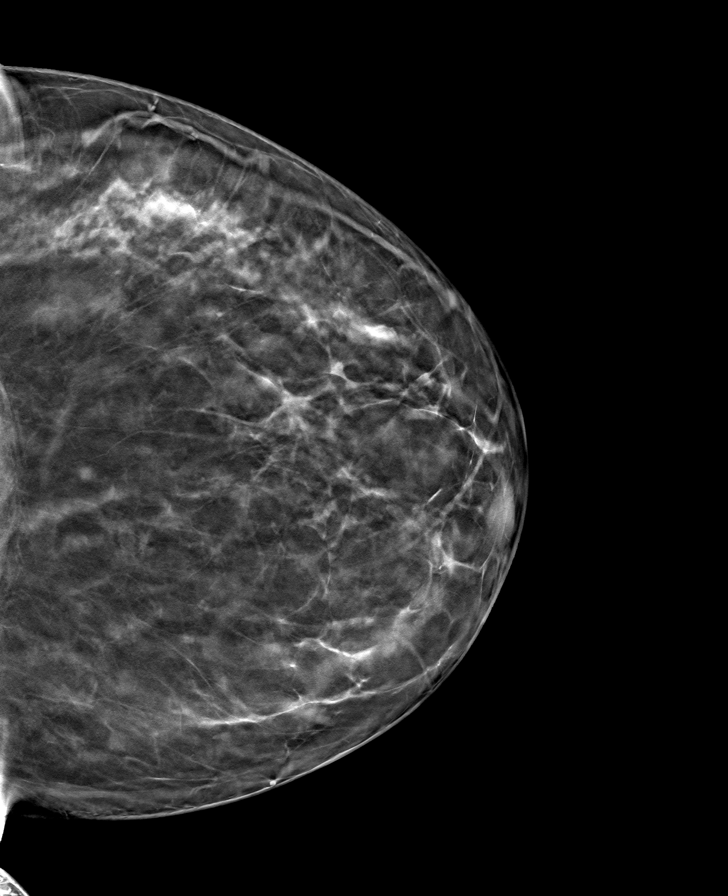

[L MLO tomo · tomo slice 45/90.0]
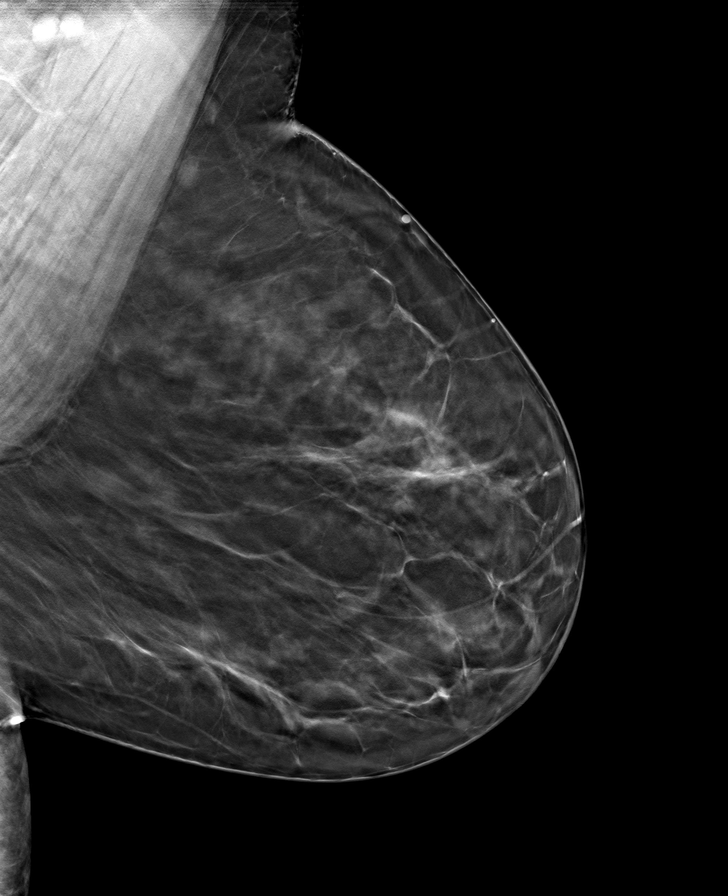

[R MLO tomo · tomo slice 47/92.0]
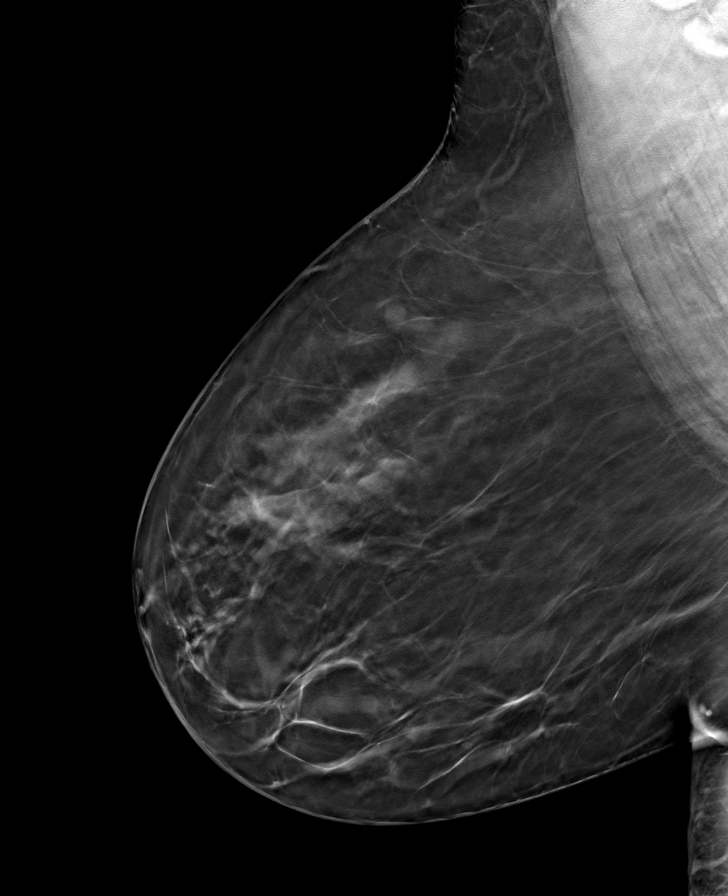

[8 of 24 positions shown; findings below may reference images not displayed]

ACR Breast Density Category b: There are scattered areas of
fibroglandular density.
FINDINGS: There are no findings suspicious for malignancy.
IMPRESSION: No mammographic evidence of malignancy. A result letter of this
screening mammogram will be mailed directly to the patient.

RECOMMENDATION:
Screening mammogram in one year. (Code:51-O-LD2)

BI-RADS CATEGORY  1: Negative.

## 2022-10-11 ENCOUNTER — Other Ambulatory Visit: Payer: Self-pay | Admitting: Internal Medicine

## 2022-10-11 DIAGNOSIS — Z1231 Encounter for screening mammogram for malignant neoplasm of breast: Secondary | ICD-10-CM

## 2022-12-05 ENCOUNTER — Ambulatory Visit
Admission: RE | Admit: 2022-12-05 | Discharge: 2022-12-05 | Disposition: A | Discharge status: Home / Self Care | Payer: Medicaid Other | Source: Ambulatory Visit | Attending: Internal Medicine | Admitting: Internal Medicine

## 2022-12-05 DIAGNOSIS — Z1231 Encounter for screening mammogram for malignant neoplasm of breast: Secondary | ICD-10-CM

## 2023-01-09 ENCOUNTER — Other Ambulatory Visit: Payer: Self-pay | Admitting: Internal Medicine

## 2023-01-10 LAB — URINE CULTURE
MICRO NUMBER:: 14645029
SPECIMEN QUALITY:: ADEQUATE

## 2023-05-01 ENCOUNTER — Other Ambulatory Visit: Payer: Self-pay | Admitting: Internal Medicine

## 2023-05-02 LAB — URINE CULTURE
MICRO NUMBER:: 15119026
Result:: NO GROWTH
SPECIMEN QUALITY:: ADEQUATE

## 2023-11-06 ENCOUNTER — Other Ambulatory Visit: Payer: Self-pay | Admitting: Internal Medicine

## 2023-11-06 DIAGNOSIS — Z1231 Encounter for screening mammogram for malignant neoplasm of breast: Secondary | ICD-10-CM

## 2023-12-12 ENCOUNTER — Ambulatory Visit
Admission: RE | Admit: 2023-12-12 | Discharge: 2023-12-12 | Disposition: A | Discharge status: Home / Self Care | Payer: Medicaid Other | Source: Ambulatory Visit | Attending: Internal Medicine | Admitting: Internal Medicine

## 2023-12-12 DIAGNOSIS — Z1231 Encounter for screening mammogram for malignant neoplasm of breast: Secondary | ICD-10-CM

## 2024-10-21 ENCOUNTER — Other Ambulatory Visit: Payer: Self-pay | Admitting: Internal Medicine

## 2024-10-21 DIAGNOSIS — Z1231 Encounter for screening mammogram for malignant neoplasm of breast: Secondary | ICD-10-CM

## 2024-12-12 ENCOUNTER — Ambulatory Visit
Admission: RE | Admit: 2024-12-12 | Discharge: 2024-12-12 | Disposition: A | Source: Ambulatory Visit | Attending: Internal Medicine | Admitting: Internal Medicine

## 2024-12-12 DIAGNOSIS — Z1231 Encounter for screening mammogram for malignant neoplasm of breast: Secondary | ICD-10-CM
